# Patient Record
Sex: Female | Born: 1955
Health system: Southern US, Community
[De-identification: ages and names within clinical notes are randomized; demographics above are authoritative.]

## PROBLEM LIST (undated history)

## (undated) DIAGNOSIS — F419 Anxiety disorder, unspecified: Secondary | ICD-10-CM

## (undated) DIAGNOSIS — G8929 Other chronic pain: Secondary | ICD-10-CM

## (undated) DIAGNOSIS — G43909 Migraine, unspecified, not intractable, without status migrainosus: Secondary | ICD-10-CM

## (undated) DIAGNOSIS — B1920 Unspecified viral hepatitis C without hepatic coma: Secondary | ICD-10-CM

## (undated) DIAGNOSIS — M549 Dorsalgia, unspecified: Secondary | ICD-10-CM

## (undated) DIAGNOSIS — R109 Unspecified abdominal pain: Secondary | ICD-10-CM

## (undated) DIAGNOSIS — K660 Peritoneal adhesions (postprocedural) (postinfection): Secondary | ICD-10-CM

## (undated) HISTORY — PX: CHOLECYSTECTOMY: SHX55

## (undated) HISTORY — PX: GALLBLADDER SURGERY: SHX652

## (undated) HISTORY — PX: ABDOMINAL HYSTERECTOMY: SHX81

## (undated) HISTORY — PX: APPENDECTOMY: SHX54

---

## 2012-01-14 ENCOUNTER — Emergency Department (HOSPITAL_COMMUNITY)
Admission: EM | Admit: 2012-01-14 | Discharge: 2012-01-14 | Disposition: A | Discharge status: Home / Self Care | Payer: Self-pay | Attending: Emergency Medicine | Admitting: Emergency Medicine

## 2012-01-14 ENCOUNTER — Encounter (HOSPITAL_COMMUNITY): Payer: Self-pay | Admitting: *Deleted

## 2012-01-14 DIAGNOSIS — Z79899 Other long term (current) drug therapy: Secondary | ICD-10-CM | POA: Insufficient documentation

## 2012-01-14 DIAGNOSIS — G8929 Other chronic pain: Secondary | ICD-10-CM | POA: Insufficient documentation

## 2012-01-14 DIAGNOSIS — F17200 Nicotine dependence, unspecified, uncomplicated: Secondary | ICD-10-CM | POA: Insufficient documentation

## 2012-01-14 DIAGNOSIS — R109 Unspecified abdominal pain: Secondary | ICD-10-CM | POA: Insufficient documentation

## 2012-01-14 HISTORY — DX: Other chronic pain: G89.29

## 2012-01-14 HISTORY — DX: Dorsalgia, unspecified: M54.9

## 2012-01-14 MED ORDER — OXYCODONE-ACETAMINOPHEN 7.5-325 MG PO TABS: 1.0000 | ORAL_TABLET | ORAL | Status: AC | PRN

## 2012-01-14 NOTE — ED Provider Notes (Signed)
History     CSN: [id number]  Arrival date & time 01/14/12  1719   First MD Initiated Contact with Patient 01/14/12 [phone]      Chief Complaint  Patient presents with  . out of her meds     (Consider location/radiation/quality/duration/timing/severity/associated sxs/prior treatment) The history is provided by the patient.   Patient here with requesting refill of her methadone. She has not town and does not have a supply with her. She takes methadone due to chronic abdominal pain. Denies any fever or vomiting currently. Takes no other analgesics. Nothing makes her symptoms worse. She is otherwise at her baseline Past Medical History  Diagnosis Date  . Chronic back pain     History reviewed. No pertinent past surgical history.  No family history on file.  History  Substance Use Topics  . Smoking status: Current Everyday Smoker  . Smokeless tobacco: Not on file  . Alcohol Use: No    OB History    Grav Para Term Preterm Abortions TAB SAB Ect Mult Living                  Review of Systems  All other systems reviewed and are negative.    Allergies  Review of patient's allergies indicates no known allergies.  Home Medications   Current Outpatient Rx  Name Route Sig Dispense Refill  . METHADONE HCL 10 MG/ML PO CONC Oral Take 120 mg by mouth daily. Receives treatment at [facility]      BP 143/85  Pulse 78  Temp(Src) 98.4 F (36.9 C) (Oral)  Resp 18  SpO2 97%  Physical Exam  Nursing note and vitals reviewed. Constitutional: She is oriented to person, place, and time. Vital signs are normal. She appears well-developed and well-nourished.  Non-toxic appearance.  HENT:  Head: Normocephalic and atraumatic.  Eyes: Conjunctivae are normal. Pupils are equal, round, and reactive to light.  Neck: Normal range of motion.  Cardiovascular: Normal rate.   Pulmonary/Chest: Effort normal.  Neurological: She is alert and oriented to person, place, and time.    Skin: Skin is warm and dry.  Psychiatric: She has a normal mood and affect.    ED Course  Procedures (including critical care time)  Labs Reviewed - No data to display No results found.   No diagnosis found.    MDM  Patient to be given prescription for 6 Percocet tablets. She was instructed to call her methadone clinic back in [place] so they can arrange for her to have medications        Toy Baker, MD 01/14/12 1858

## 2012-01-14 NOTE — ED Notes (Signed)
Pt stated that she left her medications at home, [place]. She just came to town today to visit a family member. No cardiac or respiratory distress. Will continue to monitor.

## 2012-01-14 NOTE — ED Notes (Signed)
The pt is here visiting from out-of town and she left or ran out of her meds and she is here to get med s refilled.  [place] the train.  Family member is ill

## 2012-01-14 NOTE — Discharge Instructions (Signed)
Call your methadone clinic tomorrow in [place] to have them arrange for you to be seen in a methadone clinic here in [place]  Chronic Pain Chronic pain can be defined as pain that is lasting, off and on, and lasts for 3 to 6 months or longer. Many things cause chronic pain, which can make it difficult to make a discrete diagnosis. There are many treatment options available for chronic pain. However, finding a treatment that works well for you may require trying various approaches until a suitable one is found. CAUSES  In some types of chronic medical conditions, the pain is caused by a normal pain response within the body. A normal pain response helps the body identify illness or injury and prevent further damage from being done. In these cases, the cause of the pain may be identified and treated, even if it may not be cured completely. Examples of chronic conditions which can cause chronic pain include:  Inflammation of the joints (arthritis).   Back pain or neck pain (including bulging or herniated disks).   Migraine headaches.   Cancer.  In some other types of chronic pain syndromes, the pain is caused by an abnormal pain response within the body. An abnormal pain response is present when there is no ongoing cause (or stimulus) for the pain, or when the cause of the pain is arising from the nerves or nervous system itself. Examples of conditions which can cause chronic pain due to an abnormal pain response include:  Fibromyalgia.   Reflex sympathetic dystrophy (RSD).   Neuropathy (when the nerves themselves are damaged, and may cause pain).  DIAGNOSIS  Your caregiver will help diagnose your condition over time. In many cases, the initial focus will be on excluding conditions that could be causing the pain. Depending on your symptoms, your caregiver may order some tests to diagnose your condition. Some of these tests include:  Blood tests.   Computerized X-ray scans (CT scan).    Computerized magnetic scans (MRI).   X-rays.   Ultrasounds.   Nerve conduction studies.   Consultation with other physicians or specialists.  TREATMENT  There are many treatment options for people suffering from chronic pain. Finding a treatment that works well may take time.   You may be referred to a pain management specialist.   You may be put on medication to help with the pain. Unfortunately, some medications (such as opiate medications) may not be very effective in cases where chronic pain is due to abnormal pain responses. Finding the right medications can take some time.   Adjunctive therapies may be used to provide additional relief and improve a patient's quality of life. These therapies include:   Mindfulness meditation.   Acupuncture.   Biofeedback.   Cognitive-behavioral therapy.   In certain cases, surgical interventions may be attempted.  HOME CARE INSTRUCTIONS   Make sure you understand these instructions prior to discharge.   Ask any questions and share any further concerns you have with your caregiver prior to discharge.   Take all medications as directed by your caregiver.   Keep all follow-up appointments.  SEEK MEDICAL CARE IF:   Your pain gets worse.   You develop a new pain that was not present before.   You cannot tolerate any medications prescribed by your caregiver.   You develop new symptoms since your last visit with your caregiver.  SEEK IMMEDIATE MEDICAL CARE IF:   You develop muscular weakness.   You have decreased sensation or numbness.  You lose control of bowel or bladder function.   Your pain suddenly gets much worse.   You have an oral temperature above 102 F (38.9 C), not controlled by medication.   You develop shaking chills, confusion, chest pain, or shortness of breath.  Document Released: 06/23/2002 Document Revised: 09/20/2011 Document Reviewed: 09/29/2008 [facility] Patient Information 2012 [place], [place].

## 2012-02-18 ENCOUNTER — Emergency Department (HOSPITAL_COMMUNITY)
Admission: EM | Admit: 2012-02-18 | Discharge: 2012-02-18 | Disposition: A | Discharge status: Home / Self Care | Payer: Self-pay | Attending: Emergency Medicine | Admitting: Emergency Medicine

## 2012-02-18 ENCOUNTER — Emergency Department (HOSPITAL_COMMUNITY): Payer: Self-pay

## 2012-02-18 ENCOUNTER — Encounter (HOSPITAL_COMMUNITY): Payer: Self-pay | Admitting: *Deleted

## 2012-02-18 DIAGNOSIS — F17200 Nicotine dependence, unspecified, uncomplicated: Secondary | ICD-10-CM | POA: Insufficient documentation

## 2012-02-18 DIAGNOSIS — R109 Unspecified abdominal pain: Secondary | ICD-10-CM | POA: Insufficient documentation

## 2012-02-18 DIAGNOSIS — G8929 Other chronic pain: Secondary | ICD-10-CM | POA: Insufficient documentation

## 2012-02-18 DIAGNOSIS — K5900 Constipation, unspecified: Secondary | ICD-10-CM | POA: Insufficient documentation

## 2012-02-18 DIAGNOSIS — M549 Dorsalgia, unspecified: Secondary | ICD-10-CM | POA: Insufficient documentation

## 2012-02-18 HISTORY — DX: Unspecified viral hepatitis C without hepatic coma: B19.20

## 2012-02-18 HISTORY — DX: Other chronic pain: G89.29

## 2012-02-18 HISTORY — DX: Unspecified abdominal pain: R10.9

## 2012-02-18 HISTORY — DX: Migraine, unspecified, not intractable, without status migrainosus: G43.909

## 2012-02-18 LAB — CBC
MCH: 30.3 pg (ref 26.0–34.0)
Platelets: 143 10*3/uL — ABNORMAL LOW (ref 150–400)
RBC: 4.36 MIL/uL (ref 3.87–5.11)
WBC: 5 10*3/uL (ref 4.0–10.5)

## 2012-02-18 LAB — COMPREHENSIVE METABOLIC PANEL
ALT: 28 U/L (ref 0–35)
AST: 42 U/L — ABNORMAL HIGH (ref 0–37)
Alkaline Phosphatase: 90 U/L (ref 39–117)
GFR calc Af Amer: >90 mL/min (ref >90)
Glucose, Bld: 82 mg/dL (ref 70–99)
Potassium: 3.9 mEq/L (ref 3.5–5.1)
Sodium: 136 mEq/L (ref 135–145)
Total Protein: 7.5 g/dL (ref 6.0–8.3)

## 2012-02-18 LAB — DIFFERENTIAL
Eosinophils Absolute: 0.1 10*3/uL (ref 0.0–0.7)
Lymphocytes Relative: 42 % (ref 12–46)
Lymphs Abs: 2.1 10*3/uL (ref 0.7–4.0)
Neutrophils Relative %: 42 % — ABNORMAL LOW (ref 43–77)

## 2012-02-18 MED ORDER — GI COCKTAIL ~~LOC~~
30.0000 mL | Freq: Once | ORAL | Status: AC
  Administered 2012-02-18: 30 mL via ORAL
  Filled 2012-02-18: qty 30

## 2012-02-18 MED ORDER — OXYCODONE-ACETAMINOPHEN 5-325 MG PO TABS
2.0000 | ORAL_TABLET | Freq: Once | ORAL | Status: AC
  Administered 2012-02-18: 2 via ORAL
  Filled 2012-02-18: qty 2

## 2012-02-18 MED ORDER — ONDANSETRON 8 MG PO TBDP
8.0000 mg | ORAL_TABLET | Freq: Once | ORAL | Status: AC
  Administered 2012-02-18: 8 mg via ORAL
  Filled 2012-02-18: qty 1

## 2012-02-18 NOTE — ED Notes (Signed)
Pt reports RUQ pain nausea x 3 days.  Pt denies vomiting or diarrhea at this time.

## 2012-02-18 NOTE — ED Notes (Signed)
Pt changing into gown

## 2012-02-18 NOTE — ED Notes (Signed)
Pt reports she has had two gallbladder surgeries with last surgery one year ago. Pt reports no abdominal pain until January 2013 and then began to have abdominal pain again. Pt PCP told her she may have adhesions causing pain. Pt was seeing a pain clinic in [place]. Pt reports she moved here and has not seen an MD since. Pt reports this episode of pain began a month ago and reports the last three days has been more intense. Pt reports pain in upper right quadrant that she reports is painful to touch.  Pt described pain as a soreness. Pt reports nausea, but denies V/D.  Pt has tried methadone for pain, but has been without this medication x1 week.  Pt reports methadone has helped her pain, but has not established a PCP in this area and has run out of this medication.

## 2012-02-18 NOTE — Discharge Instructions (Signed)
RESOURCE GUIDE  Dental Problems  Patients with Medicaid: [facility]                     [facility] [address]                                           [address] Phone:  [phone]                                                  Phone:  [phone]  If unable to pay or uninsured, contact:  [facility] or [facility] to become qualified for the adult dental clinic.  Chronic Pain Problems Contact [facility]  [phone] Patients need to be referred by their primary care doctor.  Insufficient Money for Medicine Contact [employer]:  call "211" or [facility] 271-5999.  No Primary Care Doctor Call [facility]  [phone] Other agencies that provide inexpensive medical care    [facility]  [phone]    [facility]  [phone]    [facility]  [phone]    Women's Clinic  [phone]    [facility]  [phone]    [facility]  [phone]  Psychological Services [facility]  [phone] [facility]  [phone] [facility]   [phone] (emergency services [phone])  Substance Abuse Resources [facility]  336-882-2125 [facility] 336-784-9470 [facility] 336-285-9073 [facility] 336-845-3988 Residential & Outpatient Substance Abuse Program  800-659-3381  Abuse/Neglect [place] Child Abuse Hotline (336) 641-3795 [place] Child Abuse Hotline 800-378-5315 (After Hours)  Emergency Shelter [facility] (336) 271-5985  Maternity Homes [facility] (336) 275-9566 [facility] (704) 372-4663  MRSA Hotline #:   832-7006    [place] [facility]  [facility] [place]     [employer]                          [facility] [address] [place]                       [address]      [address]  [place]                                                [place]                            [place] Phone:  [phone]                                   Phone:  [phone]                 Phone:  [phone]  [facility] Phone:  [phone]    [facility] Child Abuse Hotline 651-009-6809 (626) 874-3853 (After Hours)   Take over the counter laxative (such as miralax, milk of magnesia, senokot) AND a dulcolax suppository today and repeat both tomorrow.  Begin to take over the counter stool softener (colace), as directed on packaging, for the next month.  Continue to take your usual prescriptions as previously directed.  Call your regular medical doctor tomorrow morning to schedule a follow up appointment within the next week.  Return to the Emergency Department immediately if worsening.

## 2012-02-18 NOTE — ED Provider Notes (Signed)
History     CSN: [id number]  Arrival date & time 02/18/12  1717   First MD Initiated Contact with Patient 02/18/12 1758      Chief Complaint  Patient presents with  . Abdominal Pain    HPI Pt was seen at 1810.  Per pt, c/o gradual onset and persistence of constant acute flair of her chronic abd pain for 1 year, worse over the last 3 days.  Has been associated with nausea.  States she has run out of her usual methadone that she takes for her chronic pain (rx by her MD in [place]).  Pt has been eval in the ED for same last month, endorses she has not found a local PMD or Pain Management doctor yet.  Denies any change in her usual chronic pain pattern, no vomiting/diarrhea, no fevers, no back pain, no dysuria, no CP/SOB.    Past Medical History  Diagnosis Date  . Chronic back pain   . Hepatitis C   . Migraine   . Chronic abdominal pain     Past Surgical History  Procedure Date  . Cholecystectomy   . Appendectomy   . Abdominal hysterectomy     History  Substance Use Topics  . Smoking status: Current Everyday Smoker  . Smokeless tobacco: Not on file  . Alcohol Use: No    Review of Systems ROS: Statement: All systems negative except as marked or noted in the HPI; Constitutional: Negative for fever and chills. ; ; Eyes: Negative for eye pain, redness and discharge. ; ; ENMT: Negative for ear pain, hoarseness, nasal congestion, sinus pressure and sore throat. ; ; Cardiovascular: Negative for chest pain, palpitations, diaphoresis, dyspnea and peripheral edema. ; ; Respiratory: Negative for cough, wheezing and stridor. ; ; Gastrointestinal: +nausea, abd pain. Negative for vomiting, diarrhea, blood in stool, hematemesis, jaundice and rectal bleeding. . ; ; Genitourinary: Negative for dysuria, flank pain and hematuria. ; ; Musculoskeletal: Negative for back pain and neck pain. Negative for swelling and trauma.; ; Skin: Negative for pruritus, rash, abrasions, blisters, bruising and skin  lesion.; ; Neuro: Negative for headache, lightheadedness and neck stiffness. Negative for weakness, altered level of consciousness , altered mental status, extremity weakness, paresthesias, involuntary movement, seizure and syncope.     Allergies  Stadol; Talwin; Toradol; and Morphine and related  Home Medications  No current outpatient prescriptions on file.  BP 119/88  Pulse 72  Temp(Src) 98.9 F (37.2 C) (Oral)  Resp 18  SpO2 100%  Physical Exam 1815: Physical examination:  Nursing notes reviewed; Vital signs and O2 SAT reviewed;  Constitutional: Well developed, Well nourished, Well hydrated, In no acute distress; Head:  Normocephalic, atraumatic; Eyes: EOMI, PERRL, No scleral icterus; ENMT: Mouth and pharynx normal, Mucous membranes moist; Neck: Supple, Full range of motion, No lymphadenopathy; Cardiovascular: Regular rate and rhythm, No murmur, rub, or gallop; Respiratory: Breath sounds clear & equal bilaterally, No rales, rhonchi, wheezes, or rub, Normal respiratory effort/excursion; Chest: Nontender, Movement normal; Abdomen: Soft, +mid-epigastric area tender to palp.  No rebound or guarding. Nondistended, Normal bowel sounds; Extremities: Pulses normal, No tenderness, No edema, No calf edema or asymmetry.; Neuro: AA&Ox3, Major CN grossly intact.  No gross focal motor or sensory deficits in extremities.; Skin: Color normal, Warm, Dry   ED Course  Procedures    MDM  MDM Reviewed: nursing note, vitals and previous chart Interpretation: labs and x-ray     Results for orders placed during the hospital encounter of 02/18/12  CBC  Component Value Range   WBC 5.0  4.0 - 10.5 (K/uL)   RBC 4.36  3.87 - 5.11 (MIL/uL)   Hemoglobin 13.2  12.0 - 15.0 (g/dL)   HCT 04.5  40.9 - 81.1 (%)   MCV 91.3  78.0 - 100.0 (fL)   MCH 30.3  26.0 - 34.0 (pg)   MCHC 33.2  30.0 - 36.0 (g/dL)   RDW 91.4  78.2 - 95.6 (%)   Platelets 143 (*) 150 - 400 (K/uL)  DIFFERENTIAL      Component Value  Range   Neutrophils Relative 42 (*) 43 - 77 (%)   Neutro Abs 2.1  1.7 - 7.7 (K/uL)   Lymphocytes Relative 42  12 - 46 (%)   Lymphs Abs 2.1  0.7 - 4.0 (K/uL)   Monocytes Relative 13 (*) 3 - 12 (%)   Monocytes Absolute 0.7  0.1 - 1.0 (K/uL)   Eosinophils Relative 3  0 - 5 (%)   Eosinophils Absolute 0.1  0.0 - 0.7 (K/uL)   Basophils Relative 0  0 - 1 (%)   Basophils Absolute 0.0  0.0 - 0.1 (K/uL)  COMPREHENSIVE METABOLIC PANEL      Component Value Range   Sodium 136  135 - 145 (mEq/L)   Potassium 3.9  3.5 - 5.1 (mEq/L)   Chloride 100  96 - 112 (mEq/L)   CO2 28  19 - 32 (mEq/L)   Glucose, Bld 82  70 - 99 (mg/dL)   BUN 9  6 - 23 (mg/dL)   Creatinine, Ser 2.13  0.50 - 1.10 (mg/dL)   Calcium 9.0  8.4 - 08.6 (mg/dL)   Total Protein 7.5  6.0 - 8.3 (g/dL)   Albumin 3.4 (*) 3.5 - 5.2 (g/dL)   AST 42 (*) 0 - 37 (U/L)   ALT 28  0 - 35 (U/L)   Alkaline Phosphatase 90  39 - 117 (U/L)   Total Bilirubin 0.2 (*) 0.3 - 1.2 (mg/dL)   GFR calc non Af Amer >90  >90 (mL/min)   GFR calc Af Amer >90  >90 (mL/min)  LIPASE, BLOOD      Component Value Range   Lipase 18  11 - 59 (U/L)    Dg Abd Acute W/chest 02/18/2012  *RADIOLOGY REPORT*  Clinical Data: Upper abdominal pain for 3 days.  Tobacco use.  ACUTE ABDOMEN SERIES (ABDOMEN 2 VIEW & CHEST 1 VIEW)  Comparison: None.  Findings: Cardiac and mediastinal contours appear normal.  The lungs appear clear.  No pleural effusion is identified.  No free intraperitoneal gas noted.  Prominence of stool throughout the colon suggests constipation.  No significant abnormal air-fluid levels or dilated small bowel noted.  IMPRESSION:  1. Prominence of stool throughout the colon suggests constipation.  Original Report Authenticated By: Dellia Cloud, M.D.     Ruth.Sill:  Pt has tol PO well while in ED without N/V.  No stooling while in ED.  VS remain stable.  Mild AST elevation, but no old to compare.  Dx testing d/w pt.  Questions answered.  Verb understanding,  agreeable to d/c home with outpt f/u.         Laray Anger, DO 02/20/12 2157

## 2012-04-09 ENCOUNTER — Encounter (HOSPITAL_COMMUNITY): Payer: Self-pay | Admitting: *Deleted

## 2012-04-09 DIAGNOSIS — Z888 Allergy status to other drugs, medicaments and biological substances status: Secondary | ICD-10-CM | POA: Insufficient documentation

## 2012-04-09 DIAGNOSIS — R319 Hematuria, unspecified: Secondary | ICD-10-CM | POA: Insufficient documentation

## 2012-04-09 DIAGNOSIS — F17200 Nicotine dependence, unspecified, uncomplicated: Secondary | ICD-10-CM | POA: Insufficient documentation

## 2012-04-09 DIAGNOSIS — Z885 Allergy status to narcotic agent status: Secondary | ICD-10-CM | POA: Insufficient documentation

## 2012-04-09 DIAGNOSIS — N390 Urinary tract infection, site not specified: Secondary | ICD-10-CM | POA: Insufficient documentation

## 2012-04-09 DIAGNOSIS — Z90710 Acquired absence of both cervix and uterus: Secondary | ICD-10-CM | POA: Insufficient documentation

## 2012-04-09 DIAGNOSIS — B1920 Unspecified viral hepatitis C without hepatic coma: Secondary | ICD-10-CM | POA: Insufficient documentation

## 2012-04-09 NOTE — ED Notes (Signed)
Pt c/o blood in urine while at work and swelling in lower legs.  Also c/o right sided sharp abdominal pain.

## 2012-04-10 ENCOUNTER — Emergency Department (HOSPITAL_COMMUNITY)
Admission: EM | Admit: 2012-04-10 | Discharge: 2012-04-10 | Disposition: A | Discharge status: Home / Self Care | Payer: Self-pay | Attending: Emergency Medicine | Admitting: Emergency Medicine

## 2012-04-10 DIAGNOSIS — N390 Urinary tract infection, site not specified: Secondary | ICD-10-CM

## 2012-04-10 HISTORY — DX: Peritoneal adhesions (postprocedural) (postinfection): K66.0

## 2012-04-10 LAB — BASIC METABOLIC PANEL
BUN: 11 mg/dL (ref 6–23)
CO2: 27 mEq/L (ref 19–32)
Chloride: 103 mEq/L (ref 96–112)
GFR calc non Af Amer: >90 mL/min (ref >90)
Glucose, Bld: 80 mg/dL (ref 70–99)
Potassium: 3.5 mEq/L (ref 3.5–5.1)

## 2012-04-10 LAB — CBC WITH DIFFERENTIAL/PLATELET
Eosinophils Absolute: 0.2 10*3/uL (ref 0.0–0.7)
Hemoglobin: 12.3 g/dL (ref 12.0–15.0)
Lymphs Abs: 2.2 10*3/uL (ref 0.7–4.0)
MCH: 30.5 pg (ref 26.0–34.0)
Monocytes Relative: 17 % — ABNORMAL HIGH (ref 3–12)
Neutrophils Relative %: 37 % — ABNORMAL LOW (ref 43–77)
RBC: 4.03 MIL/uL (ref 3.87–5.11)

## 2012-04-10 LAB — URINE MICROSCOPIC-ADD ON

## 2012-04-10 LAB — URINALYSIS, ROUTINE W REFLEX MICROSCOPIC
Ketones, ur: NEGATIVE mg/dL
Nitrite: POSITIVE — AB
Urobilinogen, UA: 0.2 mg/dL (ref 0.0–1.0)
pH: 5.5 (ref 5.0–8.0)

## 2012-04-10 MED ORDER — CIPROFLOXACIN IN D5W 400 MG/200ML IV SOLN
400.0000 mg | Freq: Once | INTRAVENOUS | Status: AC
  Administered 2012-04-10: 400 mg via INTRAVENOUS
  Filled 2012-04-10: qty 200

## 2012-04-10 MED ORDER — ONDANSETRON HCL 4 MG PO TABS: 4.0000 mg | ORAL_TABLET | Freq: Three times a day (TID) | ORAL | Status: DC | PRN

## 2012-04-10 MED ORDER — DEXTROSE 5 % IV SOLN
1.0000 g | Freq: Once | INTRAVENOUS | Status: AC
  Administered 2012-04-10: 1 g via INTRAVENOUS
  Filled 2012-04-10: qty 10

## 2012-04-10 MED ORDER — CIPROFLOXACIN HCL 500 MG PO TABS: 500.0000 mg | ORAL_TABLET | Freq: Two times a day (BID) | ORAL | Status: AC

## 2012-04-10 MED ORDER — OXYCODONE-ACETAMINOPHEN 5-325 MG PO TABS: 2.0000 | ORAL_TABLET | ORAL | Status: DC | PRN

## 2012-04-10 MED ORDER — FENTANYL CITRATE 0.05 MG/ML IJ SOLN
100.0000 ug | Freq: Once | INTRAMUSCULAR | Status: DC
  Filled 2012-04-10: qty 2

## 2012-04-10 MED ORDER — ONDANSETRON HCL 4 MG/2ML IJ SOLN
4.0000 mg | Freq: Once | INTRAMUSCULAR | Status: AC
  Administered 2012-04-10: 4 mg via INTRAVENOUS
  Filled 2012-04-10: qty 2

## 2012-04-10 MED ORDER — HYDROMORPHONE HCL PF 1 MG/ML IJ SOLN
1.0000 mg | Freq: Once | INTRAMUSCULAR | Status: AC
  Administered 2012-04-10: 1 mg via INTRAVENOUS
  Filled 2012-04-10: qty 1

## 2012-04-10 NOTE — ED Provider Notes (Signed)
History     CSN: [id number]  Arrival date & time 04/09/12  2258   First MD Initiated Contact with Patient 04/10/12 0217      Chief Complaint  Patient presents with  . Hematuria    (Consider location/radiation/quality/duration/timing/severity/associated sxs/prior treatment) HPI 56 year old female presents to emergency department complaining of right-sided abdominal pain, hematuria. Patient also complaining of swelling in her legs ongoing for the last 3 days. Patient denies previous history of leg swelling or CHF. Patient denies fever. She has had nausea without vomiting. Her right-sided abdominal pain feels like a prior kidney stone. Patient has overall felt unwell, with less energy than she normally does. She has tried to help leg swelling by keeping her legs elevated, but reports this has not helped.  Past Medical History  Diagnosis Date  . Hepatitis C   . Abdominal adhesions     Past Surgical History  Procedure Date  . Gallbladder surgery   . Abdominal hysterectomy   . Appendectomy     History reviewed. No pertinent family history.  History  Substance Use Topics  . Smoking status: Current Everyday Smoker -- 0.5 packs/day  . Smokeless tobacco: Not on file  . Alcohol Use: No    OB History    Grav Para Term Preterm Abortions TAB SAB Ect Mult Living                  Review of Systems  All other systems reviewed and are negative.    Allergies  Toradol; Stadol; Talwin; and Morphine and related  Home Medications  No current outpatient prescriptions on file.  BP 122/72  Pulse 70  Temp 97.6 F (36.4 C) (Oral)  Resp 16  SpO2 99%  Physical Exam  Nursing note and vitals reviewed. Constitutional: She is oriented to person, place, and time. She appears well-developed and well-nourished.  HENT:  Head: Normocephalic and atraumatic.  Nose: Nose normal.  Mouth/Throat: Oropharynx is clear and moist.  Eyes: Conjunctivae and EOM are normal. Pupils are equal, round,  and reactive to light.  Neck: Normal range of motion. Neck supple. No JVD present. No tracheal deviation present. No thyromegaly present.  Cardiovascular: Normal rate, regular rhythm, normal heart sounds and intact distal pulses.  Exam reveals no gallop and no friction rub.   No murmur heard. Pulmonary/Chest: Effort normal and breath sounds normal. No stridor. No respiratory distress. She has no wheezes. She has no rales. She exhibits no tenderness.  Abdominal: Soft. Bowel sounds are normal. She exhibits no distension and no mass. There is tenderness (Diffuse tenderness over right-sided abdomen, epigastric. No rebound or guarding). There is no rebound and no guarding.  Musculoskeletal: Normal range of motion. She exhibits no edema and no tenderness.  Lymphadenopathy:    She has no cervical adenopathy.  Neurological: She is oriented to person, place, and time. She exhibits normal muscle tone. Coordination normal.  Skin: Skin is dry. No rash noted. No erythema. No pallor.  Psychiatric: She has a normal mood and affect. Her behavior is normal. Judgment and thought content normal.    ED Course  Procedures (including critical care time)  Labs Reviewed  URINALYSIS, ROUTINE W REFLEX MICROSCOPIC - Abnormal; Notable for the following:    Color, Urine AMBER (*)  BIOCHEMICALS MAY BE AFFECTED BY COLOR   APPearance CLOUDY (*)     Hgb urine dipstick MODERATE (*)     Bilirubin Urine SMALL (*)     Nitrite POSITIVE (*)     Leukocytes, UA  MODERATE (*)     All other components within normal limits  URINE MICROSCOPIC-ADD ON - Abnormal; Notable for the following:    Bacteria, UA MANY (*)     All other components within normal limits  CBC WITH DIFFERENTIAL - Abnormal; Notable for the following:    Platelets 133 (*)     Neutrophils Relative 37 (*)     Monocytes Relative 17 (*)     All other components within normal limits  BASIC METABOLIC PANEL  URINALYSIS, ROUTINE W REFLEX MICROSCOPIC   No results  found.   1. Urinary tract infection       MDM  Patient with significant urinary tract infection with pain into right abdomen, may be early pyelonephritis. We'll check baseline labs, give IV antibiotics, and control nausea. If patient is able to pass by mouth trial and labs are reassuring, feel patient would be good candidate for outpatient management of early pyelonephritis.        Olivia Mackie, MD 04/10/12 [phone]

## 2012-04-10 NOTE — Discharge Instructions (Signed)
Take medications as prescribed. Return to the emergency department for fever, chills, vomiting, worsening pain or other concerning symptoms. Please followup with your doctor for recheck in 3-5 days. If you do not have a doctor, followup with one of the numbers listed below.  Urinary Tract Infection Infections of the urinary tract can start in several places. A bladder infection (cystitis), a kidney infection (pyelonephritis), and a prostate infection (prostatitis) are different types of urinary tract infections (UTIs). They usually get better if treated with medicines (antibiotics) that kill germs. Take all the medicine until it is gone. You or your child may feel better in a few days, but TAKE ALL MEDICINE or the infection may not respond and may become more difficult to treat. HOME CARE INSTRUCTIONS   Drink enough water and fluids to keep the urine clear or pale yellow. Cranberry juice is especially recommended, in addition to large amounts of water.   Avoid caffeine, tea, and carbonated beverages. They tend to irritate the bladder.   Alcohol may irritate the prostate.   Only take over-the-counter or prescription medicines for pain, discomfort, or fever as directed by your caregiver.  To prevent further infections:  Empty the bladder often. Avoid holding urine for long periods of time.   After a bowel movement, women should cleanse from front to back. Use each tissue only once.   Empty the bladder before and after sexual intercourse.  FINDING OUT THE RESULTS OF YOUR TEST Not all test results are available during your visit. If your or your child's test results are not back during the visit, make an appointment with your caregiver to find out the results. Do not assume everything is normal if you have not heard from your caregiver or the medical facility. It is important for you to follow up on all test results. SEEK MEDICAL CARE IF:   There is back pain.   Your baby is older than 3 months  with a rectal temperature of 100.5 F (38.1 C) or higher for more than 1 day.   Your or your child's problems (symptoms) are no better in 3 days. Return sooner if you or your child is getting worse.  SEEK IMMEDIATE MEDICAL CARE IF:   There is severe back pain or lower abdominal pain.   You or your child develops chills.   You have a fever.   Your baby is older than 3 months with a rectal temperature of 102 F (38.9 C) or higher.   Your baby is 78 months old or younger with a rectal temperature of 100.4 F (38 C) or higher.   There is nausea or vomiting.   There is continued burning or discomfort with urination.  MAKE SURE YOU:   Understand these instructions.   Will watch your condition.   Will get help right away if you are not doing well or get worse.  Document Released: 07/11/2005 Document Revised: 09/20/2011 Document Reviewed: 02/13/2007 [facility] Patient Information 2012 [place], [place].   RESOURCE GUIDE  Dental Problems  Patients with Medicaid: [facility] [phone] [address]  [address] Phone:  [phone]                                                                             Phone:  [phone]  If unable to pay or uninsured, contact:  [facility] or [facility]. to become qualified for the adult dental clinic.  Chronic Pain Problems Contact [facility]  [phone] Patients need to be referred by their primary care doctor.  Insufficient Money for Medicine Contact [employer]:  call "211" or [facility] 785-751-4295.  No Primary Care Doctor Call [facility]  [phone] Other agencies that provide inexpensive medical care    [facility]  [phone]    [facility]  [phone]    [facility]  [phone]    [facility]  [phone]     [facility]  4168687849    [facility]  [phone]  Psychological Services [facility]  [phone] [facility]  [phone] [facility] Mental Health   6310471785 (emergency services [phone])  Abuse/Neglect [facility] Child Abuse Hotline [phone] [facility] Child Abuse Hotline (716)693-8618 (After Hours)  Emergency Shelter [facility] (870)031-8731  [facility] [place] [facility] 8038710662 [facility] [phone]  MRSA Hotline #:   (626) 404-0712  [facility] Resources  [facility] [place] [facility]     [employer]                          [facility]. [address] [place]                        [address]          [address]  Blondell Reveal Phone:  [phone]                                     Phone:  [phone]                   Phone:  [phone]  [facility] Mental Health Phone:  [phone]  [facility] Child Abuse Hotline 708-730-8380 (785)417-8297 (After Hours)       *free text    If you develop symptoms of Shortness of Breath, Chest Pain, Swelling of lips, mouth or tongue or if your condition becomes worse with any new symptoms, see your doctor or return to the Emergency Department for immediate care. Emergency services are not intended to be a substitute for comprehensive medical attention.  Please contact your doctor for follow up if not improving as expected.   Call your doctor in 5-7 days or as directed if there is no improvement.   Community Resources: *IF YOU ARE IN IMMEDIATE DANGER CALL 911!  Abuse/Neglect:  Family Services Crisis Hotline [facility]): [phone] [facility]): 810-734-8079  After hours, holidays and weekends: (737)019-1087 National Domestic Violence  Hotline: 514-035-0708  Mental Health: [facility] Mental Health: Drucie Ip: [phone]  Health Clinics:  [facility]): [phone] Monday - Friday 8 AM - 9 PM, Saturday and Sunday 10 AM - 9 PM  [facility] [address]: [phone] Monday - Friday 8 AM - 5 PM  [facility]  [address]: [phone] Monday- Friday 8:30 AM - 5:30 PM, Sat 9 AM - 1 PM  24 HR [place] [facility] [address]: [phone] [facility]: [phone] [facility] [address]: [phone]  24 HR [facility]: [address] [phone]

## 2012-04-14 ENCOUNTER — Encounter (HOSPITAL_COMMUNITY): Payer: Self-pay | Admitting: *Deleted

## 2012-04-14 ENCOUNTER — Emergency Department (HOSPITAL_COMMUNITY): Payer: Self-pay

## 2012-04-14 ENCOUNTER — Emergency Department (HOSPITAL_COMMUNITY)
Admission: EM | Admit: 2012-04-14 | Discharge: 2012-04-14 | Disposition: A | Discharge status: Home / Self Care | Payer: Self-pay | Attending: Emergency Medicine | Admitting: Emergency Medicine

## 2012-04-14 DIAGNOSIS — R7402 Elevation of levels of lactic acid dehydrogenase (LDH): Secondary | ICD-10-CM | POA: Insufficient documentation

## 2012-04-14 DIAGNOSIS — M7989 Other specified soft tissue disorders: Secondary | ICD-10-CM | POA: Insufficient documentation

## 2012-04-14 DIAGNOSIS — R7401 Elevation of levels of liver transaminase levels: Secondary | ICD-10-CM | POA: Insufficient documentation

## 2012-04-14 DIAGNOSIS — R748 Abnormal levels of other serum enzymes: Secondary | ICD-10-CM

## 2012-04-14 DIAGNOSIS — R9431 Abnormal electrocardiogram [ECG] [EKG]: Secondary | ICD-10-CM | POA: Insufficient documentation

## 2012-04-14 DIAGNOSIS — R609 Edema, unspecified: Secondary | ICD-10-CM | POA: Insufficient documentation

## 2012-04-14 DIAGNOSIS — Z8619 Personal history of other infectious and parasitic diseases: Secondary | ICD-10-CM | POA: Insufficient documentation

## 2012-04-14 LAB — CBC WITH DIFFERENTIAL/PLATELET
Basophils Relative: 0 % (ref 0–1)
Eosinophils Absolute: 0.1 10*3/uL (ref 0.0–0.7)
Eosinophils Relative: 3 % (ref 0–5)
HCT: 38.1 % (ref 36.0–46.0)
Hemoglobin: 12.6 g/dL (ref 12.0–15.0)
MCH: 30.4 pg (ref 26.0–34.0)
MCHC: 33.1 g/dL (ref 30.0–36.0)
MCV: 91.8 fL (ref 78.0–100.0)
Monocytes Absolute: 0.5 10*3/uL (ref 0.1–1.0)
Monocytes Relative: 12 % (ref 3–12)

## 2012-04-14 LAB — COMPREHENSIVE METABOLIC PANEL
Albumin: 3.4 g/dL — ABNORMAL LOW (ref 3.5–5.2)
BUN: 7 mg/dL (ref 6–23)
Creatinine, Ser: 0.61 mg/dL (ref 0.50–1.10)
GFR calc Af Amer: >90 mL/min (ref >90)
Glucose, Bld: 81 mg/dL (ref 70–99)
Total Bilirubin: 0.2 mg/dL — ABNORMAL LOW (ref 0.3–1.2)
Total Protein: 7.5 g/dL (ref 6.0–8.3)

## 2012-04-14 MED ORDER — ALPRAZOLAM 0.5 MG PO TABS: 0.5000 mg | ORAL_TABLET | Freq: Every evening | ORAL | Status: AC | PRN

## 2012-04-14 MED ORDER — FUROSEMIDE 40 MG PO TABS: 20.0000 mg | ORAL_TABLET | Freq: Every day | ORAL | Status: DC

## 2012-04-14 MED ORDER — LORAZEPAM 1 MG PO TABS
1.0000 mg | ORAL_TABLET | Freq: Once | ORAL | Status: AC
  Administered 2012-04-14: 1 mg via ORAL
  Filled 2012-04-14: qty 1

## 2012-04-14 MED ORDER — ALPRAZOLAM 0.25 MG PO TABS: 0.5000 mg | ORAL_TABLET | Freq: Once | ORAL | Status: DC

## 2012-04-14 NOTE — ED Notes (Signed)
Pt was unable to stay for d/c vitals due to time constraints.

## 2012-04-14 NOTE — Discharge Instructions (Signed)
Your testing did not show an obvious reason for your leg swelling. The treatment will be symptomatic. I am giving her a prescription for a diuretic, which is a medication to make you urinate more. Please try to stay on a low-salt diet. Also, some blood tests of your liver were slightly elevated. There are many things which can cause this, and additional testing would normally be done as an outpatient.  Edema Edema is an abnormal build-up of fluids in tissues. Because this is partly dependent on gravity (water flows to the lowest place), it is more common in the leg sand thighs (lower extremities). It is also common in the looser tissues, like around the eyes. Painless swelling of the feet and ankles is common and increases as a person ages. It may affect both legs and may include the calves or even thighs. When squeezed, the fluid may move out of the affected area and may leave a dent for a few moments. CAUSES   Prolonged standing or sitting in one place for extended periods of time. Movement helps pump tissue fluid into the veins, and absence of movement prevents this, resulting in edema.   Varicose veins. The valves in the veins do not work as well as they should. This causes fluid to leak into the tissues.   Fluid and salt overload.   Injury, burn, or surgery to the leg, ankle, or foot, may damage veins and allow fluid to leak out.   Sunburn damages vessels. Leaky vessels allow fluid to go out into the sunburned tissues.   Allergies (from insect bites or stings, medications or chemicals) cause swelling by allowing vessels to become leaky.   Protein in the blood helps keep fluid in your vessels. Low protein, as in malnutrition, allows fluid to leak out.   Hormonal changes, including pregnancy and menstruation, cause fluid retention. This fluid may leak out of vessels and cause edema.   Medications that cause fluid retention. Examples are sex hormones, blood pressure medications, steroid  treatment, or anti-depressants.   Some illnesses cause edema, especially heart failure, kidney disease, or liver disease.   Surgery that cuts veins or lymph nodes, such as surgery done for the heart or for breast cancer, may result in edema.  DIAGNOSIS  Your caregiver is usually easily able to determine what is causing your swelling (edema) by simply asking what is wrong (getting a history) and examining you (doing a physical). Sometimes x-rays, EKG (electrocardiogram or heart tracing), and blood work may be done to evaluate for underlying medical illness. TREATMENT  General treatment includes:  Leg elevation (or elevation of the affected body part).   Restriction of fluid intake.   Prevention of fluid overload.   Compression of the affected body part. Compression with elastic bandages or support stockings squeezes the tissues, preventing fluid from entering and forcing it back into the blood vessels.   Diuretics (also called water pills or fluid pills) pull fluid out of your body in the form of increased urination. These are effective in reducing the swelling, but can have side effects and must be used only under your caregiver's supervision. Diuretics are appropriate only for some types of edema.  The specific treatment can be directed at any underlying causes discovered. Heart, liver, or kidney disease should be treated appropriately. HOME CARE INSTRUCTIONS   Elevate the legs (or affected body part) above the level of the heart, while lying down.   Avoid sitting or standing still for prolonged periods of time.  Avoid putting anything directly under the knees when lying down, and do not wear constricting clothing or garters on the upper legs.   Exercising the legs causes the fluid to work back into the veins and lymphatic channels. This may help the swelling go down.   The pressure applied by elastic bandages or support stockings can help reduce ankle swelling.   A low-salt diet  may help reduce fluid retention and decrease the ankle swelling.   Take any medications exactly as prescribed.  SEEK MEDICAL CARE IF:  Your edema is not responding to recommended treatments. SEEK IMMEDIATE MEDICAL CARE IF:   You develop shortness of breath or chest pain.   You cannot breathe when you lay down; or if, while lying down, you have to get up and go to the window to get your breath.   You are having increasing swelling without relief from treatment.   You develop a fever over 102 F (38.9 C).   You develop pain or redness in the areas that are swollen.   Tell your caregiver right away if you have gained 3 lb/1.4 kg in 1 day or 5 lb/2.3 kg in a week.  MAKE SURE YOU:   Understand these instructions.   Will watch your condition.   Will get help right away if you are not doing well or get worse.  Document Released: 10/01/2005 Document Revised: 09/20/2011 Document Reviewed: 05/19/2008 [facility] Patient Information 2012 [place], [place].  Furosemide tablets What is this medicine? FUROSEMIDE (fyoor [place] se mide) is a diuretic. It helps you make more urine and to lose salt and excess water from your body. This medicine is used to treat high blood pressure, and edema or swelling from heart, kidney, or liver disease. This medicine may be used for other purposes; ask your health care provider or pharmacist if you have questions. What should I tell my health care provider before I take this medicine? They need to know if you have any of these conditions: -abnormal blood electrolytes -diarrhea or vomiting -gout -heart disease -kidney disease, small amounts of urine, or difficulty passing urine -liver disease -an unusual or allergic reaction to furosemide, sulfa drugs, other medicines, foods, dyes, or preservatives -pregnant or trying to get pregnant -breast-feeding How should I use this medicine? Take this medicine by mouth with a glass of water. Follow the directions on the  prescription label. You may take this medicine with or without food. If it upsets your stomach, take it with food or milk. Do not take your medicine more often than directed. Remember that you will need to pass more urine after taking this medicine. Do not take your medicine at a time of day that will cause you problems. Do not take at bedtime. Talk to your pediatrician regarding the use of this medicine in children. While this drug may be prescribed for selected conditions, precautions do apply. Overdosage: If you think you have taken too much of this medicine contact a poison control center or emergency room at once. NOTE: This medicine is only for you. Do not share this medicine with others. What if I miss a dose? If you miss a dose, take it as soon as you can. If it is almost time for your next dose, take only that dose. Do not take double or extra doses. What may interact with this medicine? -aspirin and aspirin-like medicines -certain antibiotics -chloral hydrate -cisplatin -cyclosporine -digoxin -diuretics -laxatives -lithium -medicines for blood pressure -medicines that relax muscles for surgery -methotrexate -  NSAIDs, medicines for pain and inflammation like ibuprofen, naproxen, or indomethacin -phenytoin -steroid medicines like prednisone or cortisone -sucralfate This list may not describe all possible interactions. Give your health care provider a list of all the medicines, herbs, non-prescription drugs, or dietary supplements you use. Also tell them if you smoke, drink alcohol, or use illegal drugs. Some items may interact with your medicine. What should I watch for while using this medicine? Visit your doctor or health care professional for regular checks on your progress. Check your blood pressure regularly. Ask your doctor or health care professional what your blood pressure should be, and when you should contact him or her. If you are a diabetic, check your blood sugar as  directed. You may need to be on a special diet while taking this medicine. Check with your doctor. Also, ask how many glasses of fluid you need to drink a day. You must not get dehydrated. You may get drowsy or dizzy. Do not drive, use machinery, or do anything that needs mental alertness until you know how this drug affects you. Do not stand or sit up quickly, especially if you are an older patient. This reduces the risk of dizzy or fainting spells. Alcohol can make you more drowsy and dizzy. Avoid alcoholic drinks. This medicine can make you more sensitive to the sun. Keep out of the sun. If you cannot avoid being in the sun, wear protective clothing and use sunscreen. Do not use sun lamps or tanning beds/booths. What side effects may I notice from receiving this medicine? Side effects that you should report to your doctor or health care professional as soon as possible: -blood in urine or stools -dry mouth -fever or chills -hearing loss or ringing in the ears -irregular heartbeat -muscle pain or weakness, cramps -skin rash -stomach upset, pain, or nausea -tingling or numbness in the hands or feet -unusually weak or tired -vomiting or diarrhea -yellowing of the eyes or skin Side effects that usually do not require medical attention (report to your doctor or health care professional if they continue or are bothersome): -headache -loss of appetite -unusual bleeding or bruising This list may not describe all possible side effects. Call your doctor for medical advice about side effects. You may report side effects to FDA at 1-800-FDA-1088. Where should I keep my medicine? Keep out of the reach of children. Store at room temperature between 15 and 30 degrees C (59 and 86 degrees F). Protect from light. Throw away any unused medicine after the expiration date. NOTE: This sheet is a summary. It may not cover all possible information. If you have questions about this medicine, talk to your  doctor, pharmacist, or health care provider.  2012, Elsevier/Gold Standard. (09/19/2009 4:24:50 PM)  Alprazolam tablets What is this medicine? ALPRAZOLAM (al PRAY zoe lam) is a benzodiazepine. It is used to treat anxiety and panic attacks. This medicine may be used for other purposes; ask your health care provider or pharmacist if you have questions. What should I tell my health care provider before I take this medicine? They need to know if you have any of these conditions: -an alcohol or drug abuse problem -bipolar disorder, depression, psychosis or other mental health conditions -glaucoma -kidney or liver disease -lung or breathing disease -myasthenia gravis -Parkinson's disease -porphyria -seizures or a history of seizures -suicidal thoughts -an unusual or allergic reaction to alprazolam, other benzodiazepines, foods, dyes, or preservatives -pregnant or trying to get pregnant -breast-feeding How should I use this  medicine? Take this medicine by mouth with a glass of water. Follow the directions on the prescription label. Take your medicine at regular intervals. Do not take it more often than directed. If you have been taking this medicine regularly for some time, do not suddenly stop taking it. You must gradually reduce the dose or you may get severe side effects. Ask your doctor or health care professional for advice. Even after you stop taking this medicine it can still affect your body for several days. Talk to your pediatrician regarding the use of this medicine in children. Special care may be needed. Overdosage: If you think you have taken too much of this medicine contact a poison control center or emergency room at once. NOTE: This medicine is only for you. Do not share this medicine with others. What if I miss a dose? If you miss a dose, take it as soon as you can. If it is almost time for your next dose, take only that dose. Do not take double or extra doses. What may  interact with this medicine? Do not take this medicine with any of the following medications: -certain medicines for HIV infection or AIDS -ketoconazole -itraconazole This medicine may also interact with the following medications: -birth control pills -certain macrolide antibiotics like clarithromycin, erythromycin, troleandomycin -cimetidine -cyclosporine -ergotamine -grapefruit juice -herbal or dietary supplements like kava kava, melatonin, dehydroepiandrosterone, DHEA, St. John's Wort or valerian -imatinib, STI-571 -isoniazid -levodopa -medicines for depression, anxiety, or psychotic disturbances -prescription pain medicines -rifampin, rifapentine, or rifabutin -some medicines for blood pressure or heart problems -some medicines for seizures like carbamazepine, oxcarbazepine, phenobarbital, phenytoin, primidone This list may not describe all possible interactions. Give your health care provider a list of all the medicines, herbs, non-prescription drugs, or dietary supplements you use. Also tell them if you smoke, drink alcohol, or use illegal drugs. Some items may interact with your medicine. What should I watch for while using this medicine? Visit your doctor or health care professional for regular checks on your progress. Your body can become dependent on this medicine. Ask your doctor or health care professional if you still need to take it. You may get drowsy or dizzy. Do not drive, use machinery, or do anything that needs mental alertness until you know how this medicine affects you. To reduce the risk of dizzy and fainting spells, do not stand or sit up quickly, especially if you are an older patient. Alcohol may increase dizziness and drowsiness. Avoid alcoholic drinks. Do not treat yourself for coughs, colds or allergies without asking your doctor or health care professional for advice. Some ingredients can increase possible side effects. What side effects may I notice from  receiving this medicine? Side effects that you should report to your doctor or health care professional as soon as possible: -allergic reactions like skin rash, itching or hives, swelling of the face, lips, or tongue -confusion, forgetfulness -depression -difficulty sleeping -difficulty speaking -feeling faint or lightheaded, falls -mood changes, excitability or aggressive behavior -muscle cramps -trouble passing urine or change in the amount of urine -unusually weak or tired Side effects that usually do not require medical attention (report to your doctor or health care professional if they continue or are bothersome): -change in sex drive or performance -changes in appetite This list may not describe all possible side effects. Call your doctor for medical advice about side effects. You may report side effects to FDA at 1-800-FDA-1088. Where should I keep my medicine? Keep out of the  reach of children. This medicine can be abused. Keep your medicine in a safe place to protect it from theft. Do not share this medicine with anyone. Selling or giving away this medicine is dangerous and against the law. Store at room temperature between 20 and 25 degrees C (68 and 77 degrees F). Throw away any unused medicine after the expiration date. NOTE: This sheet is a summary. It may not cover all possible information. If you have questions about this medicine, talk to your doctor, pharmacist, or health care provider.  2012, Elsevier/Gold Standard. (12/25/2007 10:34:46 AM)  RESOURCE GUIDE  Chronic Pain Problems: Contact [facility]  [phone] Patients need to be referred by their primary care doctor.  Insufficient Money for Medicine: Contact United Way:  call "211" or [facility] [phone].  No Primary Care Doctor: - Call [facility]  [phone] - can help you locate a primary care doctor that  accepts your insurance, provides certain services, etc. - Physician  Referral Service- [phone]  Agencies that provide inexpensive medical care: - [facility]  [phone] - [facility]  [phone] - [facility]  [phone] - Women's Clinic  [phone] - Planned Parenthood  [phone] [facility]  [phone]  Medicaid-accepting [facility] Providers: - [facility]- [address]  [phone], Mon-Fri 9am-7pm, Sat 9am-1pm - [facility]- [address] [place], [address] [place]  [phone] - [facility]- [address] [place]  [phone] [facility]- [address]  [phone] - Renaye Rakers- [address] [place], [address], [phone]  Only accepts [place] patients after they have their name  applied to their card  Self Pay (no insurance) in [place]: - Sickle Cell Patients: Dr Willey Blade, [facility]  [address] [place], [phone] - [facility]- [address] [place]  [phone]       [facility] [place]- [address]       -     [facility]- see information above (Speak to [employer] if you do not have insurance)       -  [facility]- [address] [place], [phone]       -  [facility]- [address],  [phone]       -  [facility]- [address], [phone]       -  Dr Julio Sicks-  [address], [place], [phone]       -  [facility]- [address] [phone]       -  [facility]- [address] [phone], also [address], [phone]       -    [facility]- [address] [place], [phone], 1st & 3rd Saturday   every month, 10am-1pm  1) Find a Doctor and Pay Out of Pocket Although you won't have to find out who is covered by your insurance plan, it is a good idea to ask around and get recommendations. You will then need to  call the office and see if the doctor you have chosen will accept you as a new patient and what types of options they offer for patients who are self-pay. Some doctors offer discounts  or will set up payment plans for their patients who do not have insurance, but you will need to ask so you aren't surprised when you get to your appointment.  2) Contact Your Local Health Department Not all health departments have doctors that can see patients for sick visits, but many do, so it is worth a call to see if yours does. If you don't know where your local health department is, you can check in your phone book. The CDC also has a tool to help you locate your state's health department, and many state websites also have listings of all of their local health departments.  3) Find a Walk-in Clinic If your illness is not likely to be very severe or complicated, you may want to try a walk in clinic. These are popping up all over the country in pharmacies, drugstores, and shopping centers. They're usually staffed by nurse practitioners or physician assistants that have been trained to treat common illnesses and complaints. They're usually fairly quick and inexpensive. However, if you have serious medical issues or chronic medical problems, these are probably not your best option  STD Testing - [facility] Department of [facility] [place], STD Clinic, [address], [place], phone [phone] or [phone].  Monday - Friday, call for an appointment. [facility] Department of [employer], STD Clinic, [address], [place], phone [phone] or [phone].  Monday - Friday, call for an appointment.  Abuse/Neglect: [facility] Child Abuse Hotline ([phone] [facility] Child Abuse Hotline 8128414738 (After Hours)  Emergency Shelter:  [facility] 801-162-4605  Maternity Homes: - [facility] at [facility] [place] [facility] 862-480-5115 - [facility] 5195971801  MRSA Hotline #:   810 047 3505  [facility] Resources  [facility] [place] [facility]  United Way [facility] Dept. [address]                 [address]         [address] [place] [address]  Blondell Reveal Phone:  [phone]                                  Phone:  [phone]                   Phone:  [phone]  [facility], [phone] - [phone]       -     [facility] in [place], [address]                                  [phone], Insurance  [place] Child Abuse Hotline (810) 230-4683 or 806-707-3813 (After Hours)   Behavioral Health Services  Substance Abuse Resources: - Alcohol and Drug Services  936-303-1254 - Addiction  [facility] [phone] - [facility] [place] [phone] Floydene Flock [phone] - Residential & Outpatient Substance Abuse Program  [phone]  Psychological Services: [facility]  [phone] Services  6066797619 - [facility], [phone] [place]. [address] [place], ACCESS LINE: 765-140-4211 or [phone], [employer]  Dental Assistance  If unable to pay or uninsured, contact:  [facility] or [facility]. to become qualified for the adult dental clinic.  Patients with Medicaid: [facility] 7756806843 [address], [phone] [address], [phone]  If unable to pay, or uninsured, contact [facility] (724)225-3559) or [facility] Department [phone] in [place], [phone] in [facility]) to become qualified for the adult dental clinic  Other Low-Cost Community Dental Services: - [facility]- [address] [place], [place], [place], [address], [phone],  [address], 2nd and 4th Thursday of the month at 6:30am.  10 clients each day by appointment, can sometimes see walk-in patients if someone does not show for an appointment. [facility]- [address] [place], [place], [address], [phone] - [facility]- [address] [place], [place], [address], [phone] - [facility]- [phone] [facility] Department- [phone] [facility] Department- [phone]

## 2012-04-14 NOTE — ED Notes (Signed)
To ED via self for eval of increasing BLE swelling x's 1wk. Reports resting with legs elevated over the weekend and worsening edema +1 on assessment in Bil[place] ankles and lower legs. Also c/o grief d/t loss of nephew via [place] over the weekend and needs to go to [place] to be with her sister but can't with health problem. Pt very tearful at bedside

## 2012-04-14 NOTE — ED Notes (Signed)
Pt tearful at bedside stating "I have an appointment at 11 and need to leave" Dr Preston Fleeting notified and stated he would come to her bedside and speak with her. Pt notified, she requested to have her monitor removed which was done.
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
## 2012-04-14 NOTE — ED Notes (Signed)
Pt did not have IV on arrival.
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
## 2012-04-16 ENCOUNTER — Encounter (HOSPITAL_COMMUNITY): Payer: Self-pay | Admitting: *Deleted

## 2012-05-16 ENCOUNTER — Emergency Department (HOSPITAL_COMMUNITY)
Admission: EM | Admit: 2012-05-16 | Discharge: 2012-05-16 | Disposition: A | Discharge status: Home / Self Care | Payer: Self-pay | Attending: Emergency Medicine | Admitting: Emergency Medicine

## 2012-05-16 ENCOUNTER — Encounter (HOSPITAL_COMMUNITY): Payer: Self-pay | Admitting: Emergency Medicine

## 2012-05-16 DIAGNOSIS — F411 Generalized anxiety disorder: Secondary | ICD-10-CM | POA: Insufficient documentation

## 2012-05-16 DIAGNOSIS — F419 Anxiety disorder, unspecified: Secondary | ICD-10-CM | POA: Insufficient documentation

## 2012-05-16 DIAGNOSIS — G8929 Other chronic pain: Secondary | ICD-10-CM | POA: Insufficient documentation

## 2012-05-16 DIAGNOSIS — F17200 Nicotine dependence, unspecified, uncomplicated: Secondary | ICD-10-CM | POA: Insufficient documentation

## 2012-05-16 DIAGNOSIS — Z8619 Personal history of other infectious and parasitic diseases: Secondary | ICD-10-CM | POA: Insufficient documentation

## 2012-05-16 HISTORY — DX: Anxiety disorder, unspecified: F41.9

## 2012-05-16 MED ORDER — ALPRAZOLAM 0.5 MG PO TABS: 0.5000 mg | ORAL_TABLET | Freq: Three times a day (TID) | ORAL | Status: DC | PRN

## 2012-05-16 NOTE — ED Provider Notes (Signed)
History  This chart was scribed for [employer]. Rubin Payor, MD by Erskine Emery. This patient was seen in room TR11C/TR11C and the patient's care was started at 14:40.   CSN: [id number]  Arrival date & time 05/16/12  1402   None     Chief Complaint  Patient presents with  . Anxiety  . Medication Refill    (Consider location/radiation/quality/duration/timing/severity/associated sxs/prior treatment) HPI Sydney Gonzales is a 56 y.o. female who presents to the Emergency Department complaining of panic attacks, triggered by stress, with associated insomnia, stammering, hand shaking, and headaches. Pt denies any weight loss, trouble breathing, chest pain, numbness, or weakness. Pt reports she came here about a month ago for the same complaint, was prescribed medication and takes them only when she needs them. Pt reports a long h/o depression and PTSD. Pt denies any h/o suicidal ideation or attempts of suicide. Pt reports she does smoke but doesn't drink. She claims her insurance doesn't kick in until the 15th, when she is scheduled to see a primary physician for more comprehensive care.    Past Medical History  Diagnosis Date  . Chronic back pain   . Migraine   . Chronic abdominal pain   . Hepatitis C   . Abdominal adhesions   . Anxiety     Past Surgical History  Procedure Date  . Gallbladder surgery   . Abdominal hysterectomy   . Appendectomy   . Cholecystectomy     History reviewed. No pertinent family history.  History  Substance Use Topics  . Smoking status: Current Everyday Smoker -- 0.5 packs/day  . Smokeless tobacco: Never Used  . Alcohol Use: No    OB History    Grav Para Term Preterm Abortions TAB SAB Ect Mult Living                  Review of Systems  Constitutional: Negative for fever, chills and appetite change.  Respiratory: Negative for chest tightness and shortness of breath.   Cardiovascular: Negative for chest pain.  Gastrointestinal: Negative for nausea and  vomiting.  Neurological: Positive for headaches. Negative for weakness and numbness.  Psychiatric/Behavioral: Positive for disturbed wake/sleep cycle. Negative for suicidal ideas. The patient is nervous/anxious.     Allergies  Stadol; Talwin; Toradol; Toradol; Morphine and related; Stadol; Talwin; and Morphine and related  Home Medications   Current Outpatient Rx  Name Route Sig Dispense Refill  . ALPRAZOLAM 0.5 MG PO TABS Oral Take 1 tablet (0.5 mg total) by mouth 3 (three) times daily as needed for sleep or anxiety. 10 tablet 0    BP 125/99  Pulse 51  Temp 98.1 F (36.7 C) (Oral)  Resp 16  SpO2 94%  Physical Exam  Nursing note and vitals reviewed. Constitutional: She is oriented to person, place, and time. She appears well-developed and well-nourished. No distress.  HENT:  Head: Normocephalic and atraumatic.  Eyes: EOM are normal.  Neck: Neck supple. No tracheal deviation present.  Cardiovascular: Normal rate.   Pulmonary/Chest: Effort normal. No respiratory distress.  Musculoskeletal: Normal range of motion.  Neurological: She is alert and oriented to person, place, and time.  Skin: Skin is warm and dry.  Psychiatric: She has a normal mood and affect. Her behavior is normal.    ED Course  Procedures (including critical care time) DIAGNOSTIC STUDIES: Oxygen Saturation is 94% on room air, adequate by my interpretation.    COORDINATION OF CARE: 14:52--I evaluated the patient and we discussed a treatment plan including  a refill of medication to which the pt agreed.    Labs Reviewed - No data to display No results found.   1. Anxiety       MDM   Patient requesting a refill of her anxiety medication. She states she has a history of PTSD and anxiety attacks. She was seen in the ER month ago for the same. She is without other complaints. She states her urinary symptoms cleared up. She states she has insurance starting on the 15th of on a primary care doctor then.  She's given a resource guide and some Xanax and will followup as needed.    I personally performed the services described in this documentation, which was scribed in my presence. The recorded information has been reviewed and considered.           Juliet Rude. Rubin Payor, MD 05/16/12 [phone]

## 2012-05-16 NOTE — ED Notes (Signed)
Pt c/o increased anxiety over last several days; pt sts out of Klonopin and requesting refill until can see PCP

## 2012-05-16 NOTE — ED Notes (Signed)
Pt states having depression and anxiety at home, states was given prescription for med earlier that helped her, insurance is available on the 15th and came to ED to get anti anxiety med

## 2012-05-25 ENCOUNTER — Encounter (HOSPITAL_COMMUNITY): Payer: Self-pay | Admitting: *Deleted

## 2012-05-25 ENCOUNTER — Emergency Department (HOSPITAL_COMMUNITY): Payer: Self-pay

## 2012-05-25 ENCOUNTER — Emergency Department (HOSPITAL_COMMUNITY)
Admission: EM | Admit: 2012-05-25 | Discharge: 2012-05-25 | Disposition: A | Discharge status: Home / Self Care | Payer: Self-pay | Attending: Emergency Medicine | Admitting: Emergency Medicine

## 2012-05-25 DIAGNOSIS — S93609A Unspecified sprain of unspecified foot, initial encounter: Secondary | ICD-10-CM | POA: Insufficient documentation

## 2012-05-25 DIAGNOSIS — Y92009 Unspecified place in unspecified non-institutional (private) residence as the place of occurrence of the external cause: Secondary | ICD-10-CM | POA: Insufficient documentation

## 2012-05-25 DIAGNOSIS — F17200 Nicotine dependence, unspecified, uncomplicated: Secondary | ICD-10-CM | POA: Insufficient documentation

## 2012-05-25 DIAGNOSIS — S40019A Contusion of unspecified shoulder, initial encounter: Secondary | ICD-10-CM | POA: Insufficient documentation

## 2012-05-25 DIAGNOSIS — G8929 Other chronic pain: Secondary | ICD-10-CM | POA: Insufficient documentation

## 2012-05-25 DIAGNOSIS — W010XXA Fall on same level from slipping, tripping and stumbling without subsequent striking against object, initial encounter: Secondary | ICD-10-CM | POA: Insufficient documentation

## 2012-05-25 DIAGNOSIS — W19XXXA Unspecified fall, initial encounter: Secondary | ICD-10-CM

## 2012-05-25 DIAGNOSIS — Z9089 Acquired absence of other organs: Secondary | ICD-10-CM | POA: Insufficient documentation

## 2012-05-25 DIAGNOSIS — F411 Generalized anxiety disorder: Secondary | ICD-10-CM | POA: Insufficient documentation

## 2012-05-25 DIAGNOSIS — M7989 Other specified soft tissue disorders: Secondary | ICD-10-CM | POA: Insufficient documentation

## 2012-05-25 DIAGNOSIS — S93601A Unspecified sprain of right foot, initial encounter: Secondary | ICD-10-CM

## 2012-05-25 DIAGNOSIS — R609 Edema, unspecified: Secondary | ICD-10-CM | POA: Insufficient documentation

## 2012-05-25 LAB — URINALYSIS, ROUTINE W REFLEX MICROSCOPIC
Glucose, UA: NEGATIVE mg/dL
Leukocytes, UA: NEGATIVE
Nitrite: NEGATIVE
Protein, ur: NEGATIVE mg/dL
pH: 7 (ref 5.0–8.0)

## 2012-05-25 MED ORDER — OXYCODONE-ACETAMINOPHEN 5-325 MG PO TABS
1.0000 | ORAL_TABLET | Freq: Once | ORAL | Status: AC
  Administered 2012-05-25: 1 via ORAL
  Filled 2012-05-25: qty 1

## 2012-05-25 MED ORDER — OXYCODONE-ACETAMINOPHEN 5-325 MG PO TABS: 1.0000 | ORAL_TABLET | Freq: Four times a day (QID) | ORAL | Status: DC | PRN

## 2012-05-25 NOTE — ED Notes (Signed)
[id number]<AV> Expected date:<BR> Expected time:<BR> Means of arrival:<BR> Comments:<BR> EMS/bilateral ankle pain &amp; swelling

## 2012-05-25 NOTE — ED Provider Notes (Signed)
History     CSN: [id number]  Arrival date & time 05/25/12  0001   First MD Initiated Contact with Patient 05/25/12 0305      Chief Complaint  Patient presents with  . Fall    (Consider location/radiation/quality/duration/timing/severity/associated sxs/prior treatment) Patient is a 56 y.o. female presenting with fall. The history is provided by the patient.  Fall Pertinent negatives include no abdominal pain and no hematuria.   patient states that she was at home and both her legs get weak and she fell after using the bathroom. She states she's had weakness in her legs since. She states the weakness is due to the pain from the fall. She also states that she hurts in her left shoulder. She states she's had swelling or legs. No loss of consciousness. Known head injury. She states she did bump her head but no headache or loss of consciousness. She states she has pain when she moves her left arm. She states she hurts in both of her legs also.  Past Medical History  Diagnosis Date  . Chronic back pain   . Migraine   . Chronic abdominal pain   . Hepatitis C   . Abdominal adhesions   . Anxiety     Past Surgical History  Procedure Date  . Gallbladder surgery   . Abdominal hysterectomy   . Appendectomy   . Cholecystectomy     History reviewed. No pertinent family history.  History  Substance Use Topics  . Smoking status: Current Everyday Smoker -- 0.5 packs/day  . Smokeless tobacco: Never Used  . Alcohol Use: No    OB History    Grav Para Term Preterm Abortions TAB SAB Ect Mult Living                  Review of Systems  Constitutional: Positive for fatigue.  Respiratory: Negative for cough, choking and shortness of breath.   Cardiovascular: Positive for leg swelling.  Gastrointestinal: Negative for abdominal pain and blood in stool.  Genitourinary: Negative for dysuria and hematuria.  Musculoskeletal: Positive for gait problem. Negative for joint swelling.    Neurological: Positive for weakness.  Psychiatric/Behavioral: Negative for agitation.    Allergies  Stadol; Talwin; Toradol; and Morphine and related  Home Medications   Current Outpatient Rx  Name Route Sig Dispense Refill  . ALPRAZOLAM 0.5 MG PO TABS Oral Take 1 tablet (0.5 mg total) by mouth 3 (three) times daily as needed for sleep or anxiety. 10 tablet 0  . OXYCODONE-ACETAMINOPHEN 5-325 MG PO TABS Oral Take 1-2 tablets by mouth every 6 (six) hours as needed for pain. 6 tablet 0    BP 104/61  Pulse 87  Temp 98.7 F (37.1 C) (Oral)  Resp 16  SpO2 97%  Physical Exam  Nursing note and vitals reviewed. Constitutional: She is oriented to person, place, and time. She appears well-developed and well-nourished.  HENT:  Head: Normocephalic and atraumatic.  Eyes: EOM are normal. Pupils are equal, round, and reactive to light.  Neck: Normal range of motion. Neck supple.  Cardiovascular: Normal rate, regular rhythm and normal heart sounds.   No murmur heard. Pulmonary/Chest: Effort normal and breath sounds normal. No respiratory distress. She has no wheezes. She has no rales.  Abdominal: Soft. Bowel sounds are normal. She exhibits no distension. There is no tenderness. There is no rebound and no guarding.  Musculoskeletal: She exhibits edema.       Bilateral low strength appearing edema. Tenderness over right foot.  No crepitance or deformity over bilateral lower legs. Mild tenderness to left shoulder. Range of motion is decreased eye pain. No crepitance or deformity. Neurovascularly intact distally. No lumbar tenderness.  Neurological: She is alert and oriented to person, place, and time. No cranial nerve deficit.  Skin: Skin is warm and dry.  Psychiatric: She has a normal mood and affect. Her speech is normal.    ED Course  Procedures (including critical care time)   Labs Reviewed  URINALYSIS, ROUTINE W REFLEX MICROSCOPIC   Dg Chest 2 View  05/25/2012  *RADIOLOGY REPORT*   Clinical Data: Fall, edema.  CHEST - 2 VIEW  Comparison: 04/14/2012  Findings: Heart size upper normal limits.  Mild central vascular congestion without overt edema.  No focal consolidation.  Aortic tortuosity, otherwise mediastinal contours within normal range.  No pleural effusion or pneumothorax.  Osteopenia and mild multilevel degenerative changes. Osseous density along the medial margin of the left humeral head.  IMPRESSION: Heart size upper normal with central vascular congestion. No overt edema or focal consolidation.  Osseous density along the medial margin of the left humeral head is favored to be a prominent osteophyte.  However, correlate clinically if concerned for fracture.  Original Report Authenticated By: Waneta Martins, M.D.   Dg Shoulder Left  05/25/2012  *RADIOLOGY REPORT*  Clinical Data: Left axilla pain status post fall.  LEFT SHOULDER - 2+ VIEW  Comparison: None.  Findings: Curvilinear calcific density along the medial margin of the left humeral head.  No dislocation.  IMPRESSION:  Calcification along the medial margin of the left humeral head is favored to reflect a prominent osteophyte. If clinical concern for a fracture persists, recommend a repeat radiograph in 5-10 days to evaluate for interval change or callus formation.  Original Report Authenticated By: Waneta Martins, M.D.   Dg Foot Complete Right  05/25/2012  *RADIOLOGY REPORT*  Clinical Data: Fall, foot and ankle swelling.  RIGHT FOOT COMPLETE - 3+ VIEW  Comparison: None.  Findings: Intact Lisfranc joint.  No displaced fracture or dislocation identified.  No aggressive osseous lesion. Limited ankle evaluation.  IMPRESSION: No acute osseous abnormality of the right foot identified. If clinical concern for a fracture persists, recommend a repeat radiograph in 5-10 days to evaluate for interval change or callus formation.  Original Report Authenticated By: Waneta Martins, M.D.     1. Fall   2. Right foot sprain     3. Shoulder contusion   4. Peripheral edema       MDM  Patient presents with fall. She states she had weakness in both legs. Laboratories overall reassuring. X-ray she did not show fracture in the foot. She'll follow up with her primary care as needed to check for occult fracture. X-ray of the chest and shoulder showed calcification along the humeral head. Likely osteophyte. She's not tender at this site she is more tender medially. She also has some peripheral edema and will followup her primary care Dr. She states she does have some Lasix at home but she has not been taking.        Juliet Rude. Rubin Payor, MD 05/25/12 [phone]

## 2012-05-25 NOTE — ED Notes (Signed)
Patient aware that ua sample is needed-pt unable to provide at this time.

## 2012-05-25 NOTE — ED Notes (Signed)
Pt states she is unable to give urine at this time 

## 2012-05-25 NOTE — ED Notes (Signed)
Pt noted to be crying upon admission and during initial assessment.

## 2012-05-25 NOTE — ED Notes (Signed)
Per EMS report; Pt from home: About 6 hours ago, after using the bathroom, pain and numbness in both legs causing pt to fall.  Pt was able to crawl back to her bed and waited.  Pt went to the bathroom again and then could not get up again.  EMS noted pt was able to ambulate to door to let EMS in for transport.  C/o of bilateral lower leg pain with swelling to leg and ankles.  Pain to her forehead to when she fell.  But no hematoma.  Pain in in left axillary area but no deformity noted.  Pt anxious, upset, and crying.  HR 118, BP 142/90, RR: 26. Hx of Hep C, migraines, anxiety, Recent UTI.  Pt does take Xananx when she needs it.

## 2012-05-25 NOTE — ED Notes (Signed)
Asked Pt if she was able to provide urine.  Pt still unable to at this time.

## 2012-05-28 ENCOUNTER — Emergency Department (HOSPITAL_COMMUNITY)
Admission: EM | Admit: 2012-05-28 | Discharge: 2012-05-28 | Disposition: A | Discharge status: Home / Self Care | Payer: Self-pay | Attending: Emergency Medicine | Admitting: Emergency Medicine

## 2012-05-28 ENCOUNTER — Encounter (HOSPITAL_COMMUNITY): Payer: Self-pay | Admitting: *Deleted

## 2012-05-28 DIAGNOSIS — B1920 Unspecified viral hepatitis C without hepatic coma: Secondary | ICD-10-CM | POA: Insufficient documentation

## 2012-05-28 DIAGNOSIS — F4310 Post-traumatic stress disorder, unspecified: Secondary | ICD-10-CM

## 2012-05-28 DIAGNOSIS — F411 Generalized anxiety disorder: Secondary | ICD-10-CM | POA: Insufficient documentation

## 2012-05-28 DIAGNOSIS — G8929 Other chronic pain: Secondary | ICD-10-CM | POA: Insufficient documentation

## 2012-05-28 DIAGNOSIS — F419 Anxiety disorder, unspecified: Secondary | ICD-10-CM

## 2012-05-28 MED ORDER — CLONAZEPAM 0.5 MG PO TABS
1.0000 mg | ORAL_TABLET | Freq: Once | ORAL | Status: AC
  Administered 2012-05-28: 1 mg via ORAL
  Filled 2012-05-28: qty 2

## 2012-05-28 MED ORDER — CLONAZEPAM 0.5 MG PO TABS: 1.0000 mg | ORAL_TABLET | Freq: Three times a day (TID) | ORAL | Status: DC | PRN

## 2012-05-28 NOTE — ED Notes (Signed)
Patient currently denies pain concerned and nervous of falling

## 2012-05-28 NOTE — Discharge Instructions (Signed)
 Anxiety and Panic Attacks Your caregiver has informed you that you are having an anxiety or panic attack. There may be many forms of this. Most of the time these attacks come suddenly and without warning. They come at any time of day, including periods of sleep, and at any time of life. They may be strong and unexplained. Although panic attacks are very scary, they are physically harmless. Sometimes the cause of your anxiety is not known. Anxiety is a protective mechanism of the body in its fight or flight mechanism. Most of these perceived danger situations are actually nonphysical situations (such as anxiety over losing a job). CAUSES  The causes of an anxiety or panic attack are many. Panic attacks may occur in otherwise healthy people given a certain set of circumstances. There may be a genetic cause for panic attacks. Some medications may also have anxiety as a side effect. SYMPTOMS  Some of the most common feelings are:  Intense terror.   Dizziness, feeling faint.   Hot and cold flashes.   Fear of going crazy.   Feelings that nothing is real.   Sweating.   Shaking.   Chest pain or a fast heartbeat (palpitations).   Smothering, choking sensations.   Feelings of impending doom and that death is near.   Tingling of extremities, this may be from over-breathing.   Altered reality (derealization).   Being detached from yourself (depersonalization).  Several symptoms can be present to make up anxiety or panic attacks. DIAGNOSIS  The evaluation by your caregiver will depend on the type of symptoms you are experiencing. The diagnosis of anxiety or panic attack is made when no physical illness can be determined to be a cause of the symptoms. TREATMENT  Treatment to prevent anxiety and panic attacks may include:  Avoidance of circumstances that cause anxiety.   Reassurance and relaxation.   Regular exercise.   Relaxation therapies, such as yoga.   Psychotherapy with a  psychiatrist or therapist.   Avoidance of caffeine, alcohol and illegal drugs.   Prescribed medication.  SEEK IMMEDIATE MEDICAL CARE IF:   You experience panic attack symptoms that are different than your usual symptoms.   You have any worsening or concerning symptoms.  Document Released: 10/01/2005 Document Revised: 09/20/2011 Document Reviewed: 02/02/2010 [facility] Patient Information 2012 [place], [place].    Post-Traumatic Stress Disorder If you have been diagnosed with post-traumatic stress disorder (PTSD), you have probably experienced a traumatic event in your life. These events are usually outside of the range of normal human experience and would negatively impact any normal person.  CAUSES  A person can get PTSD after living through or seeing a dangerous event such as:  An automobile accident.   War.   Natural disaster.   Rape.   Domestic violence.   Any event where there has been a threat to life.  PTSD is a real illness. PTSD Can happen to anyone at any age. Children get PTSD too. A doctor, or mental health professional with experience in treating PTSD can help you. SYMPTOMS  Not all symptoms may be present in any one person.  Distressing dreams.   Flashback: feeling the frightening event is happening again.   Avoiding activities, places, and people that remind you of the event.   Avoiding thoughts and feelings associated with the event.   Having frightening thoughts you cannot control.   Feeling on the edge with increased alertness and vigilance.   Trouble sleeping.   Feeling alone, detached from others.  Angry outbursts.   Feeling worried, guilty, or sad.   Having thoughts of hurting yourself or others.  PTSD may start soon after a frightening event or months or years later. Many war veterans have PTSD. Drinking alcohol or using drugs will not help PTSD and may even make it worse.  TREATMENT  PTSD can be treated. Treatment may include talk  therapy, medicine, or both. Either a doctor or a mental health professional who is experienced in treating PTSD can help you. Early diagnosis and treatment is best and can show more rapid improvement. Get help if you or a loved one are thinking of hurting yourself. Call your local emergency medical services if you need help immediately. Document Released: 06/26/2001 Document Revised: 09/20/2011 Document Reviewed: 06/09/2008 [facility] Patient Information 2012 [place], [place].    Narcotic and benzodiazepine use may cause drowsiness, slowed breathing or dependence.  Please use with caution and do not drive, operate machinery or watch young children alone while taking them.  Taking combinations of these medications or drinking alcohol will potentiate these effects.

## 2012-05-28 NOTE — ED Provider Notes (Signed)
History   This chart was scribed for Sydney Gonzales. Sydney Lamas, MD by Sydney Gonzales . The patient was seen in room TR05C/TR05C. Patient's care was started at 1210.    CSN: [id number]  Arrival date & time 05/28/12  1018   First MD Initiated Contact with Patient 05/28/12 1210      Chief Complaint  Patient presents with  . Fall  . Anxiety    (Consider location/radiation/quality/duration/timing/severity/associated sxs/prior treatment) HPI Sydney Gonzales is a 56 y.o. female who has a h/o anxiety presents to the Emergency Department complaining of constant, moderate anxiety with an onset of 3 days ago. Pt reports that she fell on Sunday in bathroom, when her feet became weak causing her to fall. Pt reports that since then, she has had anxiety with walking and going to the bathroom. Pt reports that she was seen here 3 days ago for the fall. Pt reports that she has mild right foot pain from the fall, that may be causing her legs to be weak. Pt denies any difficulty urinating. Pt reports that she has a f/u appointment with Dr. Hulen Skains for her anxiety. Pt reports that she has lives alone. Pt reports that she has taken 1 mg Clonopin in the past for her symptoms as needed. Pt reports a h/o PTSD and anxiety.   Past Medical History  Diagnosis Date  . Chronic back pain   . Migraine   . Chronic abdominal pain   . Hepatitis C   . Abdominal adhesions   . Anxiety     Past Surgical History  Procedure Date  . Gallbladder surgery   . Abdominal hysterectomy   . Appendectomy   . Cholecystectomy     History reviewed. No pertinent family history.  History  Substance Use Topics  . Smoking status: Current Everyday Smoker -- 0.5 packs/day  . Smokeless tobacco: Never Used  . Alcohol Use: No    OB History    Grav Para Term Preterm Abortions TAB SAB Ect Mult Living                  Review of Systems  Constitutional: Negative for chills.  Musculoskeletal: Negative for joint swelling.       Right foot  pain.   Neurological: Negative for weakness.  Psychiatric/Behavioral: The patient is nervous/anxious.   All other systems reviewed and are negative.    Allergies  Stadol; Talwin; Toradol; and Morphine and related  Home Medications   Current Outpatient Rx  Name Route Sig Dispense Refill  . ALPRAZOLAM 0.5 MG PO TABS Oral Take 1 tablet (0.5 mg total) by mouth 3 (three) times daily as needed for sleep or anxiety. 10 tablet 0    BP 136/73  Pulse 94  Temp 97.5 F (36.4 C) (Oral)  Resp 18  SpO2 97%  Physical Exam  Nursing note and vitals reviewed. Constitutional: She is oriented to person, place, and time. She appears well-developed and well-nourished. No distress.  HENT:  Head: Normocephalic and atraumatic.  Eyes: EOM are normal.  Neck: Neck supple. No tracheal deviation present.  Cardiovascular: Normal rate.   Pulmonary/Chest: Effort normal. No respiratory distress.  Musculoskeletal: Normal range of motion.  Neurological: She is alert and oriented to person, place, and time.  Reflex Scores:      Patellar reflexes are 1+ on the right side and 1+ on the left side.      1+ patellar reflexes bilaterally and symmetric. Gross sensation intact. Strength of lower extremities is 5/5.  Skin: Skin is warm and dry.  Psychiatric: Her behavior is normal. Her mood appears anxious.    ED Course  Procedures (including critical care time)  DIAGNOSTIC STUDIES: Oxygen Saturation is 97% on room air, normal by my interpretation.    COORDINATION OF CARE:  12:20-Discussed planned course of treatment with the patient, who is agreeable at this time.     Labs Reviewed - No data to display No results found.   1. Anxiety   2. PTSD (post-traumatic stress disorder)       MDM  I personally performed the services described in this documentation, which was scribed in my presence. The recorded information has been reviewed and considered.   Pt with exam here today excluding cauda equina,  able to stand and walk, distal sensation is intact, 1+ knee reflexes B.  Pt attributes to anxiety, has not her klonopin in some time.  Will give her refill and she has appt with her provider in the near future.        Sydney Gonzales. Sydney Mckellips, MD 06/02/12 1606

## 2012-05-28 NOTE — ED Notes (Signed)
Reports having a fall on Sunday, was seen here then. Having mild pain right foot and left arm but main complaint is severe anxiety about falling due to living alone.

## 2012-05-31 ENCOUNTER — Emergency Department (HOSPITAL_COMMUNITY)
Admission: EM | Admit: 2012-05-31 | Discharge: 2012-06-03 | Disposition: A | Discharge status: Other Acute Inpatient Hospital | Payer: Self-pay | Attending: Emergency Medicine | Admitting: Emergency Medicine

## 2012-05-31 ENCOUNTER — Encounter (HOSPITAL_COMMUNITY): Payer: Self-pay | Admitting: *Deleted

## 2012-05-31 DIAGNOSIS — F1910 Other psychoactive substance abuse, uncomplicated: Secondary | ICD-10-CM | POA: Insufficient documentation

## 2012-05-31 DIAGNOSIS — Z8619 Personal history of other infectious and parasitic diseases: Secondary | ICD-10-CM | POA: Insufficient documentation

## 2012-05-31 DIAGNOSIS — F329 Major depressive disorder, single episode, unspecified: Secondary | ICD-10-CM | POA: Insufficient documentation

## 2012-05-31 DIAGNOSIS — F3289 Other specified depressive episodes: Secondary | ICD-10-CM | POA: Insufficient documentation

## 2012-05-31 DIAGNOSIS — F411 Generalized anxiety disorder: Secondary | ICD-10-CM | POA: Insufficient documentation

## 2012-05-31 LAB — BASIC METABOLIC PANEL
BUN: 6 mg/dL (ref 6–23)
CO2: 31 mEq/L (ref 19–32)
Calcium: 9.4 mg/dL (ref 8.4–10.5)
Chloride: 101 mEq/L (ref 96–112)
Creatinine, Ser: 0.63 mg/dL (ref 0.50–1.10)
GFR calc Af Amer: >90 mL/min (ref >90)
GFR calc non Af Amer: >90 mL/min (ref >90)
Glucose, Bld: 117 mg/dL — ABNORMAL HIGH (ref 70–99)
Potassium: 3.8 mEq/L (ref 3.5–5.1)
Sodium: 140 mEq/L (ref 135–145)

## 2012-05-31 LAB — CBC WITH DIFFERENTIAL/PLATELET
Basophils Absolute: 0 10*3/uL (ref 0.0–0.1)
Basophils Relative: 0 % (ref 0–1)
Eosinophils Absolute: 0.2 10*3/uL (ref 0.0–0.7)
Eosinophils Relative: 3 % (ref 0–5)
HCT: 39.2 % (ref 36.0–46.0)
Hemoglobin: 12.9 g/dL (ref 12.0–15.0)
Lymphocytes Relative: 48 % — ABNORMAL HIGH (ref 12–46)
Lymphs Abs: 2.4 10*3/uL (ref 0.7–4.0)
MCH: 30.4 pg (ref 26.0–34.0)
MCHC: 32.9 g/dL (ref 30.0–36.0)
MCV: 92.2 fL (ref 78.0–100.0)
Monocytes Absolute: 0.7 10*3/uL (ref 0.1–1.0)
Monocytes Relative: 14 % — ABNORMAL HIGH (ref 3–12)
Neutro Abs: 1.7 10*3/uL (ref 1.7–7.7)
Neutrophils Relative %: 35 % — ABNORMAL LOW (ref 43–77)
Platelets: 162 10*3/uL (ref 150–400)
RBC: 4.25 MIL/uL (ref 3.87–5.11)
RDW: 14.4 % (ref 11.5–15.5)
WBC: 5 10*3/uL (ref 4.0–10.5)

## 2012-05-31 LAB — RAPID URINE DRUG SCREEN, HOSP PERFORMED
Amphetamines: NOT DETECTED
Benzodiazepines: POSITIVE — AB
Cocaine: NOT DETECTED
Opiates: NOT DETECTED
Tetrahydrocannabinol: NOT DETECTED

## 2012-05-31 LAB — ETHANOL: Alcohol, Ethyl (B): <11 mg/dL (ref 0–11)

## 2012-05-31 MED ORDER — LORAZEPAM 1 MG PO TABS
1.0000 mg | ORAL_TABLET | Freq: Four times a day (QID) | ORAL | Status: DC | PRN
  Administered 2012-06-01 – 2012-06-02 (×4): 1 mg via ORAL
  Filled 2012-05-31 (×4): qty 1

## 2012-05-31 MED ORDER — LORAZEPAM 2 MG/ML IJ SOLN: 1.0000 mg | Freq: Four times a day (QID) | INTRAMUSCULAR | Status: DC | PRN

## 2012-05-31 MED ORDER — VITAMIN B-1 100 MG PO TABS
100.0000 mg | ORAL_TABLET | Freq: Every day | ORAL | Status: DC
  Administered 2012-06-01 – 2012-06-03 (×3): 100 mg via ORAL
  Filled 2012-05-31 (×3): qty 1

## 2012-05-31 MED ORDER — THIAMINE HCL 100 MG/ML IJ SOLN: 100.0000 mg | Freq: Every day | INTRAMUSCULAR | Status: DC

## 2012-05-31 MED ORDER — ADULT MULTIVITAMIN W/MINERALS CH
1.0000 | ORAL_TABLET | Freq: Every day | ORAL | Status: DC
  Administered 2012-06-01 – 2012-06-03 (×3): 1 via ORAL
  Filled 2012-05-31 (×3): qty 1

## 2012-05-31 MED ORDER — FOLIC ACID 1 MG PO TABS
1.0000 mg | ORAL_TABLET | Freq: Every day | ORAL | Status: DC
  Administered 2012-06-01 – 2012-06-03 (×3): 1 mg via ORAL
  Filled 2012-05-31 (×3): qty 1

## 2012-05-31 NOTE — [facility] Counselor (Signed)
Pt has been declined due to methadone detox not offered at [facility] per yolanda, Assessment RN. Pt has been referred to [facility]. Pt is currenly pending disposition

## 2012-05-31 NOTE — ED Notes (Signed)
Pt presents to department for evaluation of depression and suicidal ideation. Pt states "I don't want to be here anymore." denies actual plan. States long term issues with depression/PTSD and hasn't been taking medications for several years. Crying upon arrival to exam room. Denies HI at the time. States R leg pain from recent fall, no obvious deformities noted. She is conscious alert and oriented x4. Pt has several bags of belongings, (3) brown bags, (1) pink bookbag and (3) grocery bags.

## 2012-05-31 NOTE — ED Notes (Signed)
Report to Chris RN.

## 2012-05-31 NOTE — Progress Notes (Signed)
Pt withdrawing with narcotics, feels anxious.  Ordered CIWA protocol so she could have doses of Ativan if needed.

## 2012-05-31 NOTE — ED Notes (Signed)
Patient tearful reassurance rendered

## 2012-05-31 NOTE — ED Provider Notes (Signed)
History     CSN: [id number]  Arrival date & time 05/31/12  1155   First MD Initiated Contact with Patient 05/31/12 1246      Chief Complaint  Patient presents with  . Medical Clearance  . Suicidal    (Consider location/radiation/quality/duration/timing/severity/associated sxs/prior treatment) HPI Patient presents emergency department with suicidal lots with depression and anxiety.  Patient states that she also would like her methadone detox.  Patient states, that she's had a lot of personal problems that led to this current state.  Patient denies chest pain, shortness of breath, hallucinations, weakness, nausea, vomiting, diarrhea, or abdominal pain.  Patient states that her condition is not worsened by anything other than her family members giving her a hard time. Past Medical History  Diagnosis Date  . Chronic back pain   . Migraine   . Chronic abdominal pain   . Hepatitis C   . Abdominal adhesions   . Anxiety     Past Surgical History  Procedure Date  . Gallbladder surgery   . Abdominal hysterectomy   . Appendectomy   . Cholecystectomy     No family history on file.  History  Substance Use Topics  . Smoking status: Current Everyday Smoker -- 0.5 packs/day  . Smokeless tobacco: Never Used  . Alcohol Use: No    OB History    Grav Para Term Preterm Abortions TAB SAB Ect Mult Living                  Review of Systems All other systems negative except as documented in the HPI. All pertinent positives and negatives as reviewed in the HPI.  Allergies  Stadol; Talwin; Toradol; and Morphine and related  Home Medications   Current Outpatient Rx  Name Route Sig Dispense Refill  . ALPRAZOLAM 0.5 MG PO TABS Oral Take 1 tablet (0.5 mg total) by mouth 3 (three) times daily as needed for sleep or anxiety. 10 tablet 0    BP 119/82  Pulse 74  Temp 96.1 F (35.6 C) (Oral)  Resp 20  SpO2 93%  Physical Exam  Constitutional: She is oriented to person, place, and  time. She appears well-developed and well-nourished.  HENT:  Head: Normocephalic and atraumatic.  Mouth/Throat: Oropharynx is clear and moist.  Neck: Normal range of motion. Neck supple.  Cardiovascular: Normal rate, regular rhythm and normal heart sounds.   No murmur heard. Pulmonary/Chest: Effort normal and breath sounds normal.  Neurological: She is alert and oriented to person, place, and time.  Skin: Skin is warm and dry. No rash noted.  Psychiatric: Her mood appears anxious. She is not actively hallucinating. She exhibits a depressed mood. She expresses suicidal ideation. She expresses no suicidal plans.    ED Course  Procedures (including critical care time)  Labs Reviewed  URINE RAPID DRUG SCREEN (HOSP PERFORMED) - Abnormal; Notable for the following:    Benzodiazepines POSITIVE (*)     All other components within normal limits  CBC WITH DIFFERENTIAL - Abnormal; Notable for the following:    Neutrophils Relative 35 (*)     Lymphocytes Relative 48 (*)     Monocytes Relative 14 (*)     All other components within normal limits  BASIC METABOLIC PANEL - Abnormal; Notable for the following:    Glucose, Bld 117 (*)     All other components within normal limits  ETHANOL    The ACT team will be consulted.  Patient is stable at this time  MDM  Carlyle Dolly, PA-C 05/31/12 1527
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
## 2012-05-31 NOTE — ED Notes (Signed)
Patient has $40.90 in cash. One credit card, and various ID's

## 2012-05-31 NOTE — ED Notes (Addendum)
Patient reports she has been having feelings of depression, si, and needing detox.  She has a Veterinary surgeon that she has been talking to for the past 1 week.  Patient is tearful in triage and states she just does not know now much longer she can hang on.  She reports she takes a large dose of methadone daily.   She denies any other drugs/etoh

## 2012-05-31 NOTE — ED Notes (Signed)
Report given to [place], Charity fundraiser. Pt moved to [facility].

## 2012-06-01 MED ORDER — ZOLPIDEM TARTRATE 5 MG PO TABS: 5.0000 mg | ORAL_TABLET | Freq: Every evening | ORAL | Status: DC | PRN

## 2012-06-01 MED ORDER — ALUM & MAG HYDROXIDE-SIMETH 200-200-20 MG/5ML PO SUSP
30.0000 mL | ORAL | Status: DC | PRN
  Administered 2012-06-02: 30 mL via ORAL
  Filled 2012-06-01: qty 30

## 2012-06-01 MED ORDER — ACETAMINOPHEN 325 MG PO TABS
650.0000 mg | ORAL_TABLET | ORAL | Status: DC | PRN
  Filled 2012-06-01: qty 2

## 2012-06-01 MED ORDER — ONDANSETRON HCL 8 MG PO TABS
4.0000 mg | ORAL_TABLET | Freq: Three times a day (TID) | ORAL | Status: DC | PRN
  Administered 2012-06-02: 4 mg via ORAL
  Filled 2012-06-01: qty 1

## 2012-06-01 MED ORDER — LORAZEPAM 1 MG PO TABS: 1.0000 mg | ORAL_TABLET | Freq: Three times a day (TID) | ORAL | Status: DC | PRN

## 2012-06-01 NOTE — ED Notes (Signed)
Patient complains of feeling anxious and craving the Methadone. Ativan 1 mg given PRN.
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
## 2012-06-01 NOTE — ED Notes (Signed)
Report to Kim RN

## 2012-06-01 NOTE — ED Notes (Signed)
Patient up to the bathroom ambulates without problems

## 2012-06-01 NOTE — ED Provider Notes (Addendum)
Medical screening examination/treatment/procedure(s) were performed by non-physician practitioner and as supervising physician I was immediately available for consultation/collaboration.  Toy Baker, MD 06/01/12 0752  9:02 AM Pt awaiting placement. Vitals stable. Pt comfortable  Toy Baker, MD 06/01/12 [phone]

## 2012-06-01 NOTE — ED Notes (Signed)
Morning medications given... Patient up to shower.

## 2012-06-02 MED ORDER — IBUPROFEN 200 MG PO TABS
400.0000 mg | ORAL_TABLET | Freq: Four times a day (QID) | ORAL | Status: DC | PRN
  Administered 2012-06-02 (×2): 400 mg via ORAL
  Filled 2012-06-02 (×2): qty 2

## 2012-06-02 NOTE — ED Notes (Signed)
Pt laying down in bed. Respirations even and regular. Skin warm, dry, intact. Reports abdominal pain in left upper quadrant. It's a "cramping pain". Denies any other pain. A.O. X 3. NAD. Sitter at bedside. Environment safe.
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
## 2012-06-02 NOTE — ED Notes (Signed)
Patient states she can not take tylenol due to having Hep C. Patient states she takes Motrin at home. EDP advised and gave verbal order with readback for Motrin 400mg  Q6 PRN.

## 2012-06-02 NOTE — ED Notes (Signed)
Pt laying down in bed. Tearful. Reporting "pain all over". Unable to specify or quantify pain. Restless and agitated. Given PRN dose of Ativan.

## 2012-06-02 NOTE — ED Notes (Signed)
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

## 2012-06-02 NOTE — ED Notes (Signed)
Sleeping. Sitter at [facility]. 

## 2012-06-02 NOTE — ED Provider Notes (Addendum)
Have Sydney Gonzales is a 64 your old female had some suicidal ideation but no plans basically present for detox from methadone. She's been declined by behavioral health because they do not provide methadone detox. Patient is still currently under review by the act team for appropriate placement. Patient has been stable today in ED.      Sydney Jakes, MD 06/02/12 1704  Patient known to me from yesterday. She's been accepted at [facility] for a substance abuse treatment patient will be discharged from here. She will go there by bus.    Sydney Jakes, MD 06/03/12 1022

## 2012-06-03 NOTE — ED Notes (Signed)
Patient denies SI and has signed a No Harm Contract.

## 2012-06-03 NOTE — BH Assessment (Signed)
Assessment Note  Update:  Received call from [place] at 0900 stating pt accepted to [facility] to Dr. Deneen Harts.  Updated EDP Zackowski and ED staff.  Called CSW Frederico Hamman, to see if pt could be transported via taxi, as pt denies SI/HI and so that pt would not have to be made IVC to transport.  Pt is voluntary for methadone detox.  Taxi voucher obtained by CSW and given to nurse for pt to be transported to [place] at 10:45.  Updated assessment disposition, completed assessment notification and faxed to [facility] to log.     Disposition:  Disposition Disposition of Patient: Inpatient treatment program Type of inpatient treatment program: Adult Patient referred to: Other (Comment) (Pt accepted [facility])  On Site Evaluation by:   Reviewed with Physician:  Delorse Limber, Rennis Harding 06/03/2012 10:33 AM

## 2012-06-03 NOTE — ED Notes (Signed)
Breakfast ordered @ 02:50-8/20, spoke w/Phylissa 

## 2012-06-05 NOTE — ED Notes (Signed)
CSW was contacted by ACT to assist getting Pt to [facility] for a voluntary detox admission from benzos.  Pt experiencing multiple detox symptoms and is unable to secure a ride at this time d/t [place] being out of state. Pt has had multiple related ED admissions in the last 30 days.  Pt is unable to navigate PART system d/t unfamiliarity and difficulty concentrating secondary to detox at this time. Pt's safety and tx motivation is best supported by use of taxi at this time. Pt agreeable. Nursing staff aware and able to assist Pt to taxi at [place]. [facility] aware that Pt will be arriving via taxi.    Frederico Hamman, LCSW [phone]

## 2012-06-15 ENCOUNTER — Emergency Department (HOSPITAL_COMMUNITY)
Admission: EM | Admit: 2012-06-15 | Discharge: 2012-06-15 | Disposition: A | Discharge status: Home / Self Care | Payer: Self-pay | Attending: Emergency Medicine | Admitting: Emergency Medicine

## 2012-06-15 ENCOUNTER — Encounter (HOSPITAL_COMMUNITY): Payer: Self-pay | Admitting: Emergency Medicine

## 2012-06-15 DIAGNOSIS — F411 Generalized anxiety disorder: Secondary | ICD-10-CM | POA: Insufficient documentation

## 2012-06-15 DIAGNOSIS — F17200 Nicotine dependence, unspecified, uncomplicated: Secondary | ICD-10-CM | POA: Insufficient documentation

## 2012-06-15 DIAGNOSIS — G8929 Other chronic pain: Secondary | ICD-10-CM | POA: Insufficient documentation

## 2012-06-15 DIAGNOSIS — F329 Major depressive disorder, single episode, unspecified: Secondary | ICD-10-CM | POA: Insufficient documentation

## 2012-06-15 DIAGNOSIS — F3289 Other specified depressive episodes: Secondary | ICD-10-CM | POA: Insufficient documentation

## 2012-06-15 DIAGNOSIS — B1920 Unspecified viral hepatitis C without hepatic coma: Secondary | ICD-10-CM | POA: Insufficient documentation

## 2012-06-15 DIAGNOSIS — F4310 Post-traumatic stress disorder, unspecified: Secondary | ICD-10-CM | POA: Insufficient documentation

## 2012-06-15 MED ORDER — ALPRAZOLAM 0.5 MG PO TABS: 0.5000 mg | ORAL_TABLET | Freq: Three times a day (TID) | ORAL | Status: DC | PRN

## 2012-06-15 NOTE — ED Notes (Signed)
Pt alert, arrives from home, c/o depression, recent family stressors, resp even unlabored, skin pwd, denies SI/HI
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
## 2012-06-16 ENCOUNTER — Encounter (HOSPITAL_COMMUNITY): Payer: Self-pay | Admitting: Emergency Medicine

## 2012-06-16 ENCOUNTER — Emergency Department (HOSPITAL_COMMUNITY)
Admission: EM | Admit: 2012-06-16 | Discharge: 2012-06-16 | Disposition: A | Discharge status: Home / Self Care | Payer: No Typology Code available for payment source | Attending: Emergency Medicine | Admitting: Emergency Medicine

## 2012-06-16 ENCOUNTER — Emergency Department (HOSPITAL_COMMUNITY): Payer: Self-pay

## 2012-06-16 DIAGNOSIS — M549 Dorsalgia, unspecified: Secondary | ICD-10-CM | POA: Insufficient documentation

## 2012-06-16 DIAGNOSIS — F411 Generalized anxiety disorder: Secondary | ICD-10-CM | POA: Insufficient documentation

## 2012-06-16 DIAGNOSIS — G8929 Other chronic pain: Secondary | ICD-10-CM | POA: Insufficient documentation

## 2012-06-16 DIAGNOSIS — M25519 Pain in unspecified shoulder: Secondary | ICD-10-CM | POA: Insufficient documentation

## 2012-06-16 DIAGNOSIS — R109 Unspecified abdominal pain: Secondary | ICD-10-CM | POA: Insufficient documentation

## 2012-06-16 DIAGNOSIS — F17200 Nicotine dependence, unspecified, uncomplicated: Secondary | ICD-10-CM | POA: Insufficient documentation

## 2012-06-16 MED ORDER — TRAMADOL HCL 50 MG PO TABS
50.0000 mg | ORAL_TABLET | Freq: Once | ORAL | Status: AC
  Administered 2012-06-16: 50 mg via ORAL
  Filled 2012-06-16: qty 1

## 2012-06-16 MED ORDER — TRAMADOL HCL 50 MG PO TABS: 50.0000 mg | ORAL_TABLET | Freq: Four times a day (QID) | ORAL | Status: DC | PRN

## 2012-06-16 MED ORDER — ALPRAZOLAM 0.5 MG PO TABS
1.0000 mg | ORAL_TABLET | Freq: Once | ORAL | Status: AC
  Administered 2012-06-16: 1 mg via ORAL
  Filled 2012-06-16: qty 2
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
## 2012-06-18 NOTE — ED Provider Notes (Signed)
Medical screening examination/treatment/procedure(s) were performed by non-physician practitioner and as supervising physician I was immediately available for consultation/collaboration.    Frenchie Dangerfield R Tammie Yanda, MD 06/18/12 1100 

## 2012-06-21 ENCOUNTER — Emergency Department (HOSPITAL_COMMUNITY)
Admission: EM | Admit: 2012-06-21 | Discharge: 2012-06-23 | Disposition: A | Discharge status: Home / Self Care | Payer: Self-pay | Attending: Emergency Medicine | Admitting: Emergency Medicine

## 2012-06-21 ENCOUNTER — Encounter (HOSPITAL_COMMUNITY): Payer: Self-pay

## 2012-06-21 DIAGNOSIS — F329 Major depressive disorder, single episode, unspecified: Secondary | ICD-10-CM

## 2012-06-21 DIAGNOSIS — F32A Depression, unspecified: Secondary | ICD-10-CM

## 2012-06-21 DIAGNOSIS — F3289 Other specified depressive episodes: Secondary | ICD-10-CM | POA: Insufficient documentation

## 2012-06-21 DIAGNOSIS — R45851 Suicidal ideations: Secondary | ICD-10-CM

## 2012-06-21 LAB — CBC WITH DIFFERENTIAL/PLATELET
Basophils Absolute: 0 10*3/uL (ref 0.0–0.1)
Basophils Relative: 0 % (ref 0–1)
Eosinophils Absolute: 0.2 10*3/uL (ref 0.0–0.7)
Eosinophils Relative: 3 % (ref 0–5)
HCT: 41.3 % (ref 36.0–46.0)
MCH: 30.2 pg (ref 26.0–34.0)
MCHC: 33.2 g/dL (ref 30.0–36.0)
Monocytes Absolute: 0.7 10*3/uL (ref 0.1–1.0)
Monocytes Relative: 13 % — ABNORMAL HIGH (ref 3–12)
Neutro Abs: 1.6 10*3/uL — ABNORMAL LOW (ref 1.7–7.7)
RDW: 14.1 % (ref 11.5–15.5)

## 2012-06-21 LAB — COMPREHENSIVE METABOLIC PANEL
ALT: 36 U/L — ABNORMAL HIGH (ref 0–35)
AST: 61 U/L — ABNORMAL HIGH (ref 0–37)
Alkaline Phosphatase: 99 U/L (ref 39–117)
CO2: 29 mEq/L (ref 19–32)
Chloride: 103 mEq/L (ref 96–112)
GFR calc Af Amer: >90 mL/min (ref >90)
GFR calc non Af Amer: >90 mL/min (ref >90)
Glucose, Bld: 95 mg/dL (ref 70–99)
Potassium: 3.9 mEq/L (ref 3.5–5.1)
Sodium: 139 mEq/L (ref 135–145)
Total Bilirubin: 0.3 mg/dL (ref 0.3–1.2)

## 2012-06-21 LAB — RAPID URINE DRUG SCREEN, HOSP PERFORMED
Barbiturates: NOT DETECTED
Benzodiazepines: POSITIVE — AB

## 2012-06-21 LAB — ACETAMINOPHEN LEVEL: Acetaminophen (Tylenol), Serum: <15 ug/mL (ref 10–30)

## 2012-06-21 MED ORDER — LOPERAMIDE HCL 2 MG PO CAPS: 2.0000 mg | ORAL_CAPSULE | ORAL | Status: DC | PRN
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
## 2012-06-21 NOTE — ED Notes (Signed)
PT's personal items placed in locker # 10

## 2012-06-21 NOTE — ED Notes (Signed)
ACT member in room 

## 2012-06-21 NOTE — ED Notes (Signed)
Spoke with candy rn AC house coverage and on sitter list.
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
## 2012-06-21 NOTE — ED Notes (Signed)
Report received from Audrey RN.

## 2012-06-21 NOTE — ED Notes (Signed)
Pt sleeping.  No vitlas at this time.  Sitter at door.

## 2012-06-21 NOTE — ED Notes (Signed)
Diet sprite provided  

## 2012-06-22 MED ORDER — ONDANSETRON HCL 8 MG PO TABS
4.0000 mg | ORAL_TABLET | Freq: Three times a day (TID) | ORAL | Status: DC | PRN
  Administered 2012-06-22: 4 mg via ORAL
  Filled 2012-06-22: qty 2

## 2012-06-22 MED ORDER — LORAZEPAM 1 MG PO TABS: 1.0000 mg | ORAL_TABLET | Freq: Three times a day (TID) | ORAL | Status: DC | PRN

## 2012-06-22 MED ORDER — ACETAMINOPHEN 325 MG PO TABS
650.0000 mg | ORAL_TABLET | ORAL | Status: DC | PRN
  Administered 2012-06-22: 650 mg via ORAL
  Filled 2012-06-22: qty 2

## 2012-06-22 MED ORDER — NICOTINE 21 MG/24HR TD PT24: 21.0000 mg | MEDICATED_PATCH | Freq: Every day | TRANSDERMAL | Status: DC

## 2012-06-22 MED ORDER — ALUM & MAG HYDROXIDE-SIMETH 200-200-20 MG/5ML PO SUSP
30.0000 mL | ORAL | Status: DC | PRN
  Administered 2012-06-22: 30 mL via ORAL
  Filled 2012-06-22: qty 30

## 2012-06-22 MED ORDER — ZOLPIDEM TARTRATE 5 MG PO TABS
5.0000 mg | ORAL_TABLET | Freq: Every evening | ORAL | Status: DC | PRN
  Administered 2012-06-22: 5 mg via ORAL
  Filled 2012-06-22: qty 1

## 2012-06-22 NOTE — ED Notes (Signed)
Sitter at [facility], alert, NAD, calm, resting in bed.

## 2012-06-22 NOTE — ED Notes (Signed)
Sitter relieved for restroom break. 

## 2012-06-22 NOTE — [facility] Counselor (Signed)
Cm spoke with patient about her methadone use; pt states that she does not want to discontinue her use of methadone; pt was declined at [facility] no availability; will fax referrals to [facility] and [facility]

## 2012-06-22 NOTE — ED Provider Notes (Signed)
Filed Vitals:   06/22/12 0615  BP: 123/76  Pulse: 66  Temp: 98.4 F (36.9 C)  Resp: 18   Sydney Gonzales is a 56 y.o. female with SI, methadone Rx, under review for placement.  C/o legs throbbing similar to WD sx - she has had these symptoms with withdrawal before.  No leg swelling, no calf pain.  Jones Skene, M.D. 9:03 AM 06/22/2012   Jones Skene, MD 06/22/12 1610

## 2012-06-22 NOTE — ED Notes (Signed)
No change, intermitantly resting/sleeping, NAD, calm, sitter at [facility].

## 2012-06-22 NOTE — ED Notes (Signed)
Decaf coffee provided   

## 2012-06-22 NOTE — ED Notes (Signed)
Sitter relieved for lunch. 
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
## 2012-06-22 NOTE — Progress Notes (Signed)
Patient declined at [facility] by Jorje Guild, PA, due to exclusion criteria. Patient requires methadone maintenance treatment. Per ACT, patient refusing methadone detox. Jorje Guild, PA recommends current plan to refer patient to [facility]. Rosey Bath, RN

## 2012-06-23 MED ORDER — IBUPROFEN 200 MG PO TABS
600.0000 mg | ORAL_TABLET | Freq: Once | ORAL | Status: DC
  Filled 2012-06-23: qty 3

## 2012-06-23 NOTE — Progress Notes (Signed)
Ready to be released.  No longer suicidal.  Wants to go to methadone clinic.

## 2012-06-23 NOTE — Progress Notes (Signed)
7:45 AM Asleep when I rounded.  Waiting placement for detox from methadone, presented with depression and some suicidal ideation several days ago.

## 2012-06-23 NOTE — ED Notes (Signed)
Social Worker at bedside.

## 2012-06-23 NOTE — ED Notes (Addendum)
Resting, not sleeping, snacking, no changes, sitter at [facility].

## 2012-06-23 NOTE — ED Notes (Signed)
Per Social worker patient will be discharged

## 2012-06-23 NOTE — ED Notes (Signed)
No changes, sitter at BS. 

## 2012-06-23 NOTE — ED Notes (Signed)
Resting in bed in dark, no changes, sitter at BS. 

## 2012-06-23 NOTE — ED Notes (Addendum)
Pt states that she wants to leave to get medication from her methadone clinic. Paged assessment team regarding patient's request/status at this time. ED MD made aware. Spoke to [employer] health department who advised how to take out IVC papers if needed.

## 2012-06-23 NOTE — ED Notes (Signed)
EDMD at bedside
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
## 2012-06-23 NOTE — ED Notes (Signed)
Sleeping, sitter at Bs, no changes.

## 2012-08-06 NOTE — ED Provider Notes (Signed)
History     CSN: [id number]  Arrival date & time 06/21/12  1543   First MD Initiated Contact with Patient 06/21/12 1719      Chief Complaint  Patient presents with  . Suicidal  . Depression  . Medication Refill    (Consider location/radiation/quality/duration/timing/severity/associated sxs/prior treatment) HPI Comments: Patient presents with depression, suicidal ideation.  Recent change in her dose of methadone has made her feel worse.    Patient is a 56 y.o. female presenting with mental health disorder. The history is provided by the patient.  Mental Health Problem The primary symptoms include dysphoric mood. The current episode started this week. This is a recurrent problem.  The onset of the illness is precipitated by drug abuse and a stressful event. The degree of incapacity that she is experiencing as a consequence of her illness is moderate. Sequelae of the illness include an inability to work and harmed interpersonal relations. Additional symptoms of the illness include anhedonia and feelings of worthlessness. She admits to suicidal ideas. She does not have a plan to commit suicide. She contemplates harming herself. She has not already injured self. She does not contemplate injuring another person. She has not already  injured another person.    Past Medical History  Diagnosis Date  . Chronic back pain   . Migraine   . Chronic abdominal pain   . Hepatitis C   . Abdominal adhesions   . Anxiety     Past Surgical History  Procedure Date  . Gallbladder surgery   . Abdominal hysterectomy   . Appendectomy   . Cholecystectomy     No family history on file.  History  Substance Use Topics  . Smoking status: Current Every Day Smoker -- 0.5 packs/day  . Smokeless tobacco: Never Used  . Alcohol Use: No    OB History    Grav Para Term Preterm Abortions TAB SAB Ect Mult Living                  Review of Systems  Psychiatric/Behavioral: Positive for dysphoric mood.    All other systems reviewed and are negative.    Allergies  Stadol; Talwin; Toradol; and Morphine and related  Home Medications   Current Outpatient Rx  Name Route Sig Dispense Refill  . METHADONE HCL PO Oral Take 50 mg by mouth daily.      BP 94/67  Pulse 56  Temp 97.5 F (36.4 C) (Oral)  Resp 14  SpO2 96%  Physical Exam  Nursing note and vitals reviewed. Constitutional: She is oriented to person, place, and time. She appears well-developed and well-nourished. No distress.  HENT:  Head: Normocephalic and atraumatic.  Neck: Normal range of motion. Neck supple.  Cardiovascular: Normal rate and regular rhythm.  Exam reveals no gallop and no friction rub.   No murmur heard. Pulmonary/Chest: Effort normal and breath sounds normal. No respiratory distress. She has no wheezes.  Abdominal: Soft. Bowel sounds are normal. She exhibits no distension. There is no tenderness.  Musculoskeletal: Normal range of motion.  Neurological: She is alert and oriented to person, place, and time.  Skin: Skin is warm and dry. She is not diaphoretic.    ED Course  Procedures (including critical care time)  Labs Reviewed  CBC WITH DIFFERENTIAL - Abnormal; Notable for the following:    Neutrophils Relative 32 (*)     Neutro Abs 1.6 (*)     Lymphocytes Relative 51 (*)     Monocytes Relative 13 (*)  All other components within normal limits  URINE RAPID DRUG SCREEN (HOSP PERFORMED) - Abnormal; Notable for the following:    Benzodiazepines POSITIVE (*)     All other components within normal limits  COMPREHENSIVE METABOLIC PANEL - Abnormal; Notable for the following:    AST 61 (*)     ALT 36 (*)     All other components within normal limits  ETHANOL  ACETAMINOPHEN LEVEL  LAB REPORT - SCANNED   No results found.   1. Depression   2. Suicidal ideation       MDM   Will consult act for eval.        Geoffery Lyons, MD 08/06/12 1306

## 2016-03-16 ENCOUNTER — Emergency Department (HOSPITAL_COMMUNITY): Payer: Self-pay

## 2016-03-16 ENCOUNTER — Emergency Department (HOSPITAL_COMMUNITY)
Admission: EM | Admit: 2016-03-16 | Discharge: 2016-03-17 | Disposition: A | Discharge status: Other Acute Inpatient Hospital | Payer: No Typology Code available for payment source | Attending: Emergency Medicine | Admitting: Emergency Medicine

## 2016-03-16 DIAGNOSIS — R1084 Generalized abdominal pain: Secondary | ICD-10-CM

## 2016-03-16 DIAGNOSIS — Z79899 Other long term (current) drug therapy: Secondary | ICD-10-CM | POA: Insufficient documentation

## 2016-03-16 DIAGNOSIS — F419 Anxiety disorder, unspecified: Secondary | ICD-10-CM

## 2016-03-16 DIAGNOSIS — F314 Bipolar disorder, current episode depressed, severe, without psychotic features: Secondary | ICD-10-CM

## 2016-03-16 DIAGNOSIS — F17200 Nicotine dependence, unspecified, uncomplicated: Secondary | ICD-10-CM | POA: Insufficient documentation

## 2016-03-16 DIAGNOSIS — F322 Major depressive disorder, single episode, severe without psychotic features: Secondary | ICD-10-CM | POA: Insufficient documentation

## 2016-03-16 LAB — COMPREHENSIVE METABOLIC PANEL
ALBUMIN: 3.9 g/dL (ref 3.5–5.0)
ALK PHOS: 57 U/L (ref 38–126)
ALT: 29 U/L (ref 14–54)
AST: 34 U/L (ref 15–41)
Anion gap: 6 (ref 5–15)
BILIRUBIN TOTAL: 0.2 mg/dL — AB (ref 0.3–1.2)
BUN: 15 mg/dL (ref 6–20)
CALCIUM: 9.2 mg/dL (ref 8.9–10.3)
CO2: 24 mmol/L (ref 22–32)
CREATININE: 0.9 mg/dL (ref 0.44–1.00)
Chloride: 107 mmol/L (ref 101–111)
GFR calc Af Amer: >60 mL/min (ref >60)
GFR calc non Af Amer: >60 mL/min (ref >60)
GLUCOSE: 99 mg/dL (ref 65–99)
Potassium: 4.4 mmol/L (ref 3.5–5.1)
SODIUM: 137 mmol/L (ref 135–145)
TOTAL PROTEIN: 7.2 g/dL (ref 6.5–8.1)

## 2016-03-16 LAB — CBC
HCT: 38.8 % (ref 36.0–46.0)
Hemoglobin: 12.7 g/dL (ref 12.0–15.0)
MCH: 30 pg (ref 26.0–34.0)
MCHC: 32.7 g/dL (ref 30.0–36.0)
MCV: 91.5 fL (ref 78.0–100.0)
PLATELETS: 157 10*3/uL (ref 150–400)
RBC: 4.24 MIL/uL (ref 3.87–5.11)
RDW: 15.2 % (ref 11.5–15.5)
WBC: 5.1 10*3/uL (ref 4.0–10.5)

## 2016-03-16 LAB — LIPASE, BLOOD: Lipase: 24 U/L (ref 11–51)

## 2016-03-16 MED ORDER — IOPAMIDOL (ISOVUE-300) INJECTION 61%
100.0000 mL | Freq: Once | INTRAVENOUS | Status: AC | PRN
  Administered 2016-03-16: 100 mL via INTRAVENOUS

## 2016-03-16 MED ORDER — ONDANSETRON HCL 4 MG/2ML IJ SOLN
4.0000 mg | Freq: Once | INTRAMUSCULAR | Status: AC | PRN
  Administered 2016-03-16: 4 mg via INTRAVENOUS
  Filled 2016-03-16: qty 2

## 2016-03-16 MED ORDER — FENTANYL CITRATE (PF) 100 MCG/2ML IJ SOLN
50.0000 ug | Freq: Once | INTRAMUSCULAR | Status: AC
  Administered 2016-03-16: 50 ug via INTRAVENOUS
  Filled 2016-03-16: qty 2

## 2016-03-16 MED ORDER — DIATRIZOATE MEGLUMINE & SODIUM 66-10 % PO SOLN: 15.0000 mL | ORAL | Status: DC | PRN

## 2016-03-16 NOTE — ED Notes (Signed)
Patient complaining of abdominal pain Right upper quandrant x2 days. Patient has had nausea and vomiting. Patient has a hx of bowel obstruction and that this is what happened last time. Patient has had difficulty moving her bowels.

## 2016-03-16 NOTE — ED Notes (Signed)
RN starting IV, drawing labs 
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
## 2016-03-16 NOTE — ED Notes (Signed)
Social work at bedside.  
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
## 2016-03-17 ENCOUNTER — Encounter (HOSPITAL_COMMUNITY): Payer: Self-pay | Admitting: *Deleted

## 2016-03-17 ENCOUNTER — Inpatient Hospital Stay (HOSPITAL_COMMUNITY)
Admission: AD | Admit: 2016-03-17 | Discharge: 2016-03-21 | DRG: 885 | Disposition: A | Discharge status: Home / Self Care | Payer: No Typology Code available for payment source | Source: Intra-hospital | Attending: Psychiatry | Admitting: Psychiatry

## 2016-03-17 DIAGNOSIS — M549 Dorsalgia, unspecified: Secondary | ICD-10-CM | POA: Diagnosis present

## 2016-03-17 DIAGNOSIS — G8929 Other chronic pain: Secondary | ICD-10-CM | POA: Diagnosis present

## 2016-03-17 DIAGNOSIS — F17200 Nicotine dependence, unspecified, uncomplicated: Secondary | ICD-10-CM | POA: Diagnosis present

## 2016-03-17 DIAGNOSIS — R45851 Suicidal ideations: Secondary | ICD-10-CM | POA: Diagnosis present

## 2016-03-17 DIAGNOSIS — F410 Panic disorder [episodic paroxysmal anxiety] without agoraphobia: Secondary | ICD-10-CM | POA: Diagnosis present

## 2016-03-17 DIAGNOSIS — B1920 Unspecified viral hepatitis C without hepatic coma: Secondary | ICD-10-CM | POA: Diagnosis present

## 2016-03-17 DIAGNOSIS — G4700 Insomnia, unspecified: Secondary | ICD-10-CM | POA: Diagnosis present

## 2016-03-17 DIAGNOSIS — F314 Bipolar disorder, current episode depressed, severe, without psychotic features: Principal | ICD-10-CM | POA: Diagnosis present

## 2016-03-17 DIAGNOSIS — F411 Generalized anxiety disorder: Secondary | ICD-10-CM | POA: Diagnosis present

## 2016-03-17 LAB — URINALYSIS, ROUTINE W REFLEX MICROSCOPIC
Bilirubin Urine: NEGATIVE
GLUCOSE, UA: NEGATIVE mg/dL
Hgb urine dipstick: NEGATIVE
Ketones, ur: NEGATIVE mg/dL
NITRITE: NEGATIVE
PH: 6.5 (ref 5.0–8.0)
Protein, ur: NEGATIVE mg/dL
SPECIFIC GRAVITY, URINE: 1.039 — AB (ref 1.005–1.030)

## 2016-03-17 LAB — I-STAT BETA HCG BLOOD, ED (MC, WL, AP ONLY): I-stat hCG, quantitative: <5 m[IU]/mL (ref <5)

## 2016-03-17 LAB — URINE MICROSCOPIC-ADD ON

## 2016-03-17 MED ORDER — ONDANSETRON 4 MG PO TBDP: 4.0000 mg | ORAL_TABLET | Freq: Three times a day (TID) | ORAL | Status: DC | PRN

## 2016-03-17 MED ORDER — HYDROXYZINE HCL 25 MG PO TABS
25.0000 mg | ORAL_TABLET | Freq: Four times a day (QID) | ORAL | Status: DC | PRN
  Filled 2016-03-17: qty 10

## 2016-03-17 MED ORDER — ACETAMINOPHEN 325 MG PO TABS: 650.0000 mg | ORAL_TABLET | Freq: Four times a day (QID) | ORAL | Status: DC | PRN

## 2016-03-17 MED ORDER — CITALOPRAM HYDROBROMIDE 10 MG PO TABS
10.0000 mg | ORAL_TABLET | Freq: Every day | ORAL | Status: DC
  Filled 2016-03-17 (×4): qty 1

## 2016-03-17 MED ORDER — HYDROXYZINE HCL 25 MG PO TABS: 25.0000 mg | ORAL_TABLET | Freq: Four times a day (QID) | ORAL | Status: DC | PRN

## 2016-03-17 MED ORDER — DICYCLOMINE HCL 10 MG/ML IM SOLN
20.0000 mg | Freq: Once | INTRAMUSCULAR | Status: AC
  Administered 2016-03-17: 20 mg via INTRAMUSCULAR
  Filled 2016-03-17: qty 2

## 2016-03-17 MED ORDER — QUETIAPINE FUMARATE 100 MG PO TABS
100.0000 mg | ORAL_TABLET | Freq: Every day | ORAL | Status: DC
  Administered 2016-03-17 – 2016-03-20 (×4): 100 mg via ORAL
  Filled 2016-03-17: qty 1
  Filled 2016-03-17: qty 7
  Filled 2016-03-17 (×5): qty 1

## 2016-03-17 MED ORDER — BUSPIRONE HCL 10 MG PO TABS
10.0000 mg | ORAL_TABLET | Freq: Three times a day (TID) | ORAL | Status: DC
  Administered 2016-03-18 – 2016-03-21 (×10): 10 mg via ORAL
  Filled 2016-03-17: qty 21
  Filled 2016-03-17: qty 1
  Filled 2016-03-17: qty 21
  Filled 2016-03-17 (×9): qty 1
  Filled 2016-03-17: qty 21
  Filled 2016-03-17 (×4): qty 1

## 2016-03-17 MED ORDER — MAGNESIUM HYDROXIDE 400 MG/5ML PO SUSP: 30.0000 mL | Freq: Every day | ORAL | Status: DC | PRN

## 2016-03-17 MED ORDER — CITALOPRAM HYDROBROMIDE 10 MG PO TABS
10.0000 mg | ORAL_TABLET | Freq: Every day | ORAL | Status: DC
  Administered 2016-03-17: 10 mg via ORAL
  Filled 2016-03-17: qty 1

## 2016-03-17 MED ORDER — BUSPIRONE HCL 10 MG PO TABS
10.0000 mg | ORAL_TABLET | Freq: Three times a day (TID) | ORAL | Status: DC
  Administered 2016-03-17: 10 mg via ORAL
  Filled 2016-03-17: qty 1

## 2016-03-17 MED ORDER — FENTANYL CITRATE (PF) 100 MCG/2ML IJ SOLN
50.0000 ug | Freq: Once | INTRAMUSCULAR | Status: AC
  Administered 2016-03-17: 50 ug via INTRAVENOUS
  Filled 2016-03-17: qty 2

## 2016-03-17 MED ORDER — ALUM & MAG HYDROXIDE-SIMETH 200-200-20 MG/5ML PO SUSP
30.0000 mL | ORAL | Status: DC | PRN
  Filled 2016-03-17: qty 30

## 2016-03-17 MED ORDER — DIATRIZOATE MEGLUMINE & SODIUM 66-10 % PO SOLN: 15.0000 mL | ORAL | Status: DC | PRN

## 2016-03-17 MED ORDER — QUETIAPINE FUMARATE 100 MG PO TABS
100.0000 mg | ORAL_TABLET | Freq: Every day | ORAL | Status: DC
Start: 2016-03-17 — End: 2016-03-17

## 2016-03-17 NOTE — ED Notes (Signed)
Patient and belongings wanded by security.  
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
## 2016-03-17 NOTE — ED Notes (Signed)
Patient reports suicidal ideations with a plan to jump into traffic. Patient denies HI and AVH at this time. Plan of care discussed. Patient voices no complaints or concerns this time. Encouragement and support provided and safety maintain. Q 15 min safety checks in place.

## 2016-03-17 NOTE — ED Notes (Signed)
Patient dressing out into scrubs.
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
## 2016-03-17 NOTE — Progress Notes (Signed)
Writer entered patients room and observed her lying in bed resting. She received her scheduled hs medication. She was offered a snack because her dinner was not eaten from earlier. She has been in bed asleep, did not attend group or come to the dayroom tonight. She reports passive si and verbally contracts for safety, denies hi/a/v hallucinations. Support given and safety maintained on unit with 15 min checks. Fluids given.

## 2016-03-17 NOTE — ED Notes (Signed)
Report called and security called to wand patient and belongings.

## 2016-03-17 NOTE — BH Assessment (Signed)
Assessment completed. Consulted Maryjean Mornharles Kober, PA-C who agrees that pt meets inpatient criteria. Informed Antony MaduraKelly Humes, PA-C of the recommendation.
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
## 2016-03-17 NOTE — ED Notes (Signed)
Verbal from Ekal[place]Kelly, [place] 20MG  IM bentyl and 50mcg IV fentanyl.

## 2016-03-18 DIAGNOSIS — R45851 Suicidal ideations: Secondary | ICD-10-CM

## 2016-03-18 DIAGNOSIS — F314 Bipolar disorder, current episode depressed, severe, without psychotic features: Principal | ICD-10-CM

## 2016-03-18 LAB — TSH: TSH: 0.462 u[IU]/mL (ref 0.350–4.500)

## 2016-03-18 MED ORDER — CLONAZEPAM 0.5 MG PO TABS
0.5000 mg | ORAL_TABLET | Freq: Two times a day (BID) | ORAL | Status: DC | PRN
  Administered 2016-03-18 – 2016-03-19 (×2): 0.5 mg via ORAL
  Filled 2016-03-18 (×2): qty 1

## 2016-03-18 NOTE — [facility] Group Notes (Signed)
[facility] Group Notes:  (Nursing/MHT/Case Management/Adjunct)  Date:  03/18/2016  Time:  11:00 AM Type of Therapy:  Psychoeducational Skills  Participation Level:  Did Not Attend  Participation Quality:  DID NOT ATTEND  Affect:  DID NOT ATTEND  Cognitive:  DID NOT ATTEND  Insight:  None  Engagement in Group:  DID NOT ATTEND  Modes of Intervention:  DID NOT ATTEND  Summary of Progress/Problems: Pt did not attend patient self inventory group.    Bethann PunchesJane O Guillaume Weninger 03/18/2016, 11:00 AM

## 2016-03-18 NOTE — Progress Notes (Signed)
Psychoeducational Group Note  Date:  03/18/2016 Time:  0022  Group Topic/Focus:  Wrap-Up Group:   The focus of this group is to help patients review their daily goal of treatment and discuss progress on daily workbooks.  Participation Level: Did Not Attend  Participation Quality:  Not Applicable  Affect:  Not Applicable  Cognitive:  Not Applicable  Insight:  Not Applicable  Engagement in Group: Not Applicable  Additional Comments:  The patient did not attend group.   Kmari Brian S 03/18/2016, 12:22 AM

## 2016-03-18 NOTE — Progress Notes (Signed)
DAR NOTE: Pt present with flat affect and depressed mood in the unit. Pt has been isolating herself and has been in bed most of the time. Pt denies physical pain, but complained of being dizzy, fluid encouraged, meds given as scheduled. As per self inventory, pt refused to feel it. Pt rate depression at 8, hopeless ness at 8, and anxiety at 9. Pt's safety ensured with 15 minute and environmental checks. Pt currently denies SI/HI and A/V hallucinations. Pt verbally agrees to seek staff if SI/HI or A/VH occurs and to consult with staff before acting on these thoughts. Will continue POC.

## 2016-03-18 NOTE — H&P (Signed)
Psychiatric Admission Assessment Adult  Patient Identification: Sydney Gonzales MRN:  [id number] Date of Evaluation:  03/18/2016 Chief Complaint:  Major Depressive Disorder, single episode severe without psychosis Principal Diagnosis: Bipolar affective disorder, depressed, severe ([place]) Diagnosis:   Patient Active Problem List   Diagnosis Date Noted  . Bipolar affective disorder, depressed, severe ([place]) [F31.4] 03/17/2016  . Anxiety [F41.9]    History of Present Illness:Per HPI-60 y.o. female reporting suicidal ideations with a plan to jump into traffic. Pt stated "I want to do something quick and get it over with". "It seem like it would be better if I wasn't here". Pt reported that she has attempted suicide in the past but did not report any self-injurious behaviors. Pt denies HI and AVH at this time. PT reported that she is currently receiving mental health treatment for bipolar/depression and shared that she has been off of her medication for approximately one month. Pt stated "he won't let me take them". Pt is endorsing multiple depressive symptoms and shared that her sleep and appetite has decreased. PT reported seeing a shadow over her bed at night prior to falling asleep. Pt did not report any drug or alcohol abuse at this time. Associated Signs/Symptoms: Depression Symptoms:  depressed mood, feelings of worthlessness/guilt, difficulty concentrating, suicidal thoughts without plan, anxiety, panic attacks, (Hypo) Manic Symptoms:  Impulsivity, Irritable Mood, Anxiety Symptoms:  Excessive Worry, Social Anxiety, Psychotic Symptoms:  Hallucinations: None PTSD Symptoms: Avoidance:  Decreased Interest/Participation Foreshortened Future Total Time spent with patient: 30 minutes  Past Psychiatric History: See Above  Is the patient at risk to self? Yes.    Has the patient been a risk to self in the past 6 months? Yes.    Has the patient been a risk to self within the distant past? Yes.     Is the patient a risk to others? No.  Has the patient been a risk to others in the past 6 months? No.  Has the patient been a risk to others within the distant past? No.   Prior Inpatient Therapy:   Prior Outpatient Therapy:    Alcohol Screening: 1. How often do you have a drink containing alcohol?: Never 9. Have you or someone else been injured as a result of your drinking?: No 10. Has a relative or friend or a doctor or another health worker been concerned about your drinking or suggested you cut down?: No Alcohol Use Disorder Identification Test Final Score (AUDIT): 0 Brief Intervention: AUDIT score less than 7 or less-screening does not suggest unhealthy drinking-brief intervention not indicated Substance Abuse History in the last 12 months:  No. Consequences of Substance Abuse: NA Previous Psychotropic Medications: No  Psychological Evaluations: No  Past Medical History:  Past Medical History  Diagnosis Date  . Chronic back pain   . Migraine   . Chronic abdominal pain   . Hepatitis C   . Abdominal adhesions   . Anxiety     Past Surgical History  Procedure Laterality Date  . Gallbladder surgery    . Abdominal hysterectomy    . Appendectomy    . Cholecystectomy     Family History: History reviewed. No pertinent family history. Family Psychiatric  History: See Above Tobacco Screening: '@FLOW'$ ([phone])::1)@ Social History:  History  Alcohol Use No     History  Drug Use No    Additional Social History:      Pain Medications: none Prescriptions: none Over the Counter: none History of alcohol / drug use?: No history of  alcohol / drug abuse                    Allergies:   Allergies  Allergen Reactions  . Stadol [Butorphanol Tartrate] Shortness Of Breath    Migraine  . Talwin [Pentazocine] Shortness Of Breath    migraine  . Toradol [Ketorolac Tromethamine] Anaphylaxis and Shortness Of Breath  . Morphine And Related Itching and Rash   Lab Results:   Results for orders placed or performed during the hospital encounter of 03/16/16 (from the past 48 hour(s))  Lipase, blood     Status: None   Collection Time: 03/16/16  9:20 PM  Result Value Ref Range   Lipase 24 11 - 51 U/L  Comprehensive metabolic panel     Status: Abnormal   Collection Time: 03/16/16  9:20 PM  Result Value Ref Range   Sodium 137 135 - 145 mmol/L   Potassium 4.4 3.5 - 5.1 mmol/L   Chloride 107 101 - 111 mmol/L   CO2 24 22 - 32 mmol/L   Glucose, Bld 99 65 - 99 mg/dL   BUN 15 6 - 20 mg/dL   Creatinine, Ser 0.90 0.44 - 1.00 mg/dL   Calcium 9.2 8.9 - 10.3 mg/dL   Total Protein 7.2 6.5 - 8.1 g/dL   Albumin 3.9 3.5 - 5.0 g/dL   AST 34 15 - 41 U/L   ALT 29 14 - 54 U/L   Alkaline Phosphatase 57 38 - 126 U/L   Total Bilirubin 0.2 (L) 0.3 - 1.2 mg/dL   GFR calc non Af Amer >60 >60 mL/min   GFR calc Af Amer >60 >60 mL/min    Comment: (NOTE) The eGFR has been calculated using the CKD EPI equation. This calculation has not been validated in all clinical situations. eGFR's persistently <60 mL/min signify possible Chronic Kidney Disease.    Anion gap 6 5 - 15  CBC     Status: None   Collection Time: 03/16/16  9:20 PM  Result Value Ref Range   WBC 5.1 4.0 - 10.5 K/uL   RBC 4.24 3.87 - 5.11 MIL/uL   Hemoglobin 12.7 12.0 - 15.0 g/dL   HCT 38.8 36.0 - 46.0 %   MCV 91.5 78.0 - 100.0 fL   MCH 30.0 26.0 - 34.0 pg   MCHC 32.7 30.0 - 36.0 g/dL   RDW 15.2 11.5 - 15.5 %   Platelets 157 150 - 400 K/uL  Urinalysis, Routine w reflex microscopic     Status: Abnormal   Collection Time: 03/17/16  2:04 AM  Result Value Ref Range   Color, Urine YELLOW YELLOW   APPearance CLEAR CLEAR   Specific Gravity, Urine 1.039 (H) 1.005 - 1.030   pH 6.5 5.0 - 8.0   Glucose, UA NEGATIVE NEGATIVE mg/dL   Hgb urine dipstick NEGATIVE NEGATIVE   Bilirubin Urine NEGATIVE NEGATIVE   Ketones, ur NEGATIVE NEGATIVE mg/dL   Protein, ur NEGATIVE NEGATIVE mg/dL   Nitrite NEGATIVE NEGATIVE    Leukocytes, UA SMALL (A) NEGATIVE  Urine microscopic-add on     Status: Abnormal   Collection Time: 03/17/16  2:04 AM  Result Value Ref Range   Squamous Epithelial / LPF 0-5 (A) NONE SEEN   WBC, UA 6-30 0 - 5 WBC/hpf   RBC / HPF 0-5 0 - 5 RBC/hpf   Bacteria, UA RARE (A) NONE SEEN  I-Stat beta hCG blood, ED     Status: None   Collection Time: 03/17/16  2:46 AM  Result Value Ref Range   I-stat hCG, quantitative <5.0 <5 mIU/mL   Comment 3            Comment:   GEST. AGE      CONC.  (mIU/mL)   <=1 WEEK        5 - 50     2 WEEKS       50 - 500     3 WEEKS       100 - 10,000     4 WEEKS     1,000 - 30,000        FEMALE AND NON-PREGNANT FEMALE:     LESS THAN 5 mIU/mL     Blood Alcohol level:  Lab Results  Component Value Date   [facility] <11 06/21/2012   ETH <11 95/62/1308    Metabolic Disorder Labs:  No results found for: HGBA1C, MPG No results found for: PROLACTIN No results found for: CHOL, TRIG, HDL, CHOLHDL, VLDL, LDLCALC  Current Medications: Current Facility-Administered Medications  Medication Dose Route Frequency Provider Last Rate Last Dose  . acetaminophen (TYLENOL) tablet 650 mg  650 mg Oral Q6H PRN Patrecia Pour, NP      . alum & mag hydroxide-simeth (MAALOX/MYLANTA) 200-200-20 MG/5ML suspension 30 mL  30 mL Oral Q4H PRN Patrecia Pour, NP      . busPIRone (BUSPAR) tablet 10 mg  10 mg Oral TID Patrecia Pour, NP   10 mg at 03/18/16 1149  . citalopram (CELEXA) tablet 10 mg  10 mg Oral Daily Patrecia Pour, NP   10 mg at 03/18/16 0818  . hydrOXYzine (ATARAX/VISTARIL) tablet 25 mg  25 mg Oral Q6H PRN Patrecia Pour, NP      . magnesium hydroxide (MILK OF MAGNESIA) suspension 30 mL  30 mL Oral Daily PRN Patrecia Pour, NP      . ondansetron (ZOFRAN-ODT) disintegrating tablet 4 mg  4 mg Oral Q8H PRN Patrecia Pour, NP      . QUEtiapine (SEROQUEL) tablet 100 mg  100 mg Oral QHS Patrecia Pour, NP   100 mg at 03/17/16 2136   PTA Medications: Prescriptions prior to admission   Medication Sig Dispense Refill Last Dose  . oxyCODONE-acetaminophen (PERCOCET/ROXICET) 5-325 MG tablet Take 1 tablet by mouth every 6 (six) hours as needed for severe pain. Reported on 03/17/2016   unknown at Unknown time    Musculoskeletal: Strength & Muscle Tone: within normal limits Gait & Station: normal Patient leans: N/A  Psychiatric Specialty Exam: Physical Exam  Nursing note and vitals reviewed. Constitutional: She is oriented to person, place, and time. She appears well-developed.  Musculoskeletal: Normal range of motion.  Neurological: She is oriented to person, place, and time.  Skin: Skin is dry.  Psychiatric: She has a normal mood and affect. Her behavior is normal.    Review of Systems  Psychiatric/Behavioral: Positive for depression and suicidal ideas. Negative for hallucinations. The patient is nervous/anxious and has insomnia.   All other systems reviewed and are negative.   Blood pressure 112/83, pulse 86, temperature 98 F (36.7 C), temperature source Oral, resp. rate 18, height '5\' 6"'$  (1.676 m), weight 90.719 kg (200 lb).Body mass index is 32.3 kg/(m^2).  General Appearance: Guarded  Eye Contact:  Fair  Speech:  Clear and Coherent  Volume:  Normal  Mood:  Depressed  Affect:  Constricted  Thought Process:  Coherent  Orientation:  Full (Time, Place, and Person)  Thought Content:  Hallucinations:  None  Suicidal Thoughts:  Yes.  with intent/plan  Homicidal Thoughts:  No  Memory:  Immediate;   Fair Recent;   Fair Remote;   Fair  Judgement:  Fair  Insight:  Fair  Psychomotor Activity:  Normal  Concentration:  Concentration: Fair  Recall:  [employer] of Knowledge:  Fair  Language:  Good  Akathisia:  No  Handed:  Right  AIMS (if indicated):     Assets:  Desire for Improvement Social Support  ADL's:  Intact  Cognition:  WNL  Sleep:  Number of Hours: 6.75    I agree with current treatment plan on 03/18/2016, Patient seen face-to-face for psychiatric  evaluation follow-up, chart reviewed and case discussed with the MD Afreen. Reviewed the information documented and agree with the treatment plan.  Treatment Plan Summary: Daily contact with patient to assess and evaluate symptoms and progress in treatment and Medication management Continue with Celexa 10 mg,Buspar 10 mg PO TIDfor mood stabilization. Continue with Seroquel 100 mg for insomnia/ mood stabilization Start Klonopin 0.'5mg'$  PO. BID for increase anxiety  Will continue to monitor vitals ,medication compliance and treatment side effects while patient is here.  Reviewed labs Glucose 101 elevated ,BAL - 0, UDS - pending labs  CSW will start working on disposition.  Patient to participate in therapeutic milieu  Observation Level/Precautions:  15 minute checks  Laboratory:  CBC Chemistry Profile UDS UA  Psychotherapy:  Individual and group session  Medications:  See Above  Consultations:  Psychiatry  Discharge Concerns:  Safety, stabilization, and risk of access to medication and medication stabilization   Estimated GPC:6-1PELG  Other:     I certify that inpatient services furnished can reasonably be expected to improve the patient's condition.    Derrill Center, NP 6/4/20172:35 PM Patient seen face to face for psychiatric evaluation. Chart reviewed and finding discussed with Physician extender. Agreed with disposition and treatment plan.   Berniece Andreas, MD
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
## 2016-03-18 NOTE — Progress Notes (Signed)
Psychoeducational Group Note  Date:  03/18/2016 Time:  2144  Group Topic/Focus:  Wrap-Up Group:   The focus of this group is to help patients review their daily goal of treatment and discuss progress on daily workbooks.  Participation Level: Did Not Attend  Participation Quality:  Not Applicable  Affect:  Not Applicable  Cognitive:  Not Applicable  Insight:  Not Applicable  Engagement in Group: Not Applicable  Additional Comments:  The patient did not attend group this evening and elected to sleep.  Hazle CocaGOODMAN, Corinthian Kemler S 03/18/2016, 9:45 PM

## 2016-03-18 NOTE — Progress Notes (Signed)
Patient came to nursing station to request anxiety medication during group. Writer spoke with her 1:1 and inquired about her complaints of feeling dizzy earlier on day shift and she reports that she is not sure what is causing it. Writer re-educated her on fall precautions and encouraged her to address this with her doctor on tomorrow and Clinical research associatewriter will inform on coming nurse. Writer encouraged her to attend groups when possible since she has not attended any since being here. She reports that she doesn't like being around a lot of people. Writer informed her that if she does attend that she does not have to share unless she wants to. She was given a prn klonopin and snacks before she returned to her room. Support given and safety maintained on unit with 15 min checks.

## 2016-03-18 NOTE — BHH Group Notes (Signed)
B[facility] Group Notes: (Clinical Social Work)   03/18/2016      Type of Therapy:  Group Therapy   Participation Level:  Did Not Attend despite MHT prompting   Ambrose MantleMareida Grossman-Orr, LCSW 03/18/2016, 4:30 PM

## 2016-03-19 LAB — LIPID PANEL
CHOL/HDL RATIO: 3.2 ratio
CHOLESTEROL: 172 mg/dL (ref 0–200)
HDL: 54 mg/dL (ref >40)
LDL Cholesterol: 99 mg/dL (ref 0–99)
TRIGLYCERIDES: 96 mg/dL (ref <150)
VLDL: 19 mg/dL (ref 0–40)

## 2016-03-19 MED ORDER — QUETIAPINE FUMARATE 25 MG PO TABS
25.0000 mg | ORAL_TABLET | Freq: Every morning | ORAL | Status: DC
  Administered 2016-03-20 – 2016-03-21 (×2): 25 mg via ORAL
  Filled 2016-03-19: qty 7
  Filled 2016-03-19 (×4): qty 1

## 2016-03-19 MED ORDER — CLONAZEPAM 0.5 MG PO TABS
0.5000 mg | ORAL_TABLET | Freq: Three times a day (TID) | ORAL | Status: DC | PRN
  Administered 2016-03-19 – 2016-03-21 (×4): 0.5 mg via ORAL
  Filled 2016-03-19 (×4): qty 1

## 2016-03-19 MED ORDER — PAROXETINE HCL 20 MG PO TABS
20.0000 mg | ORAL_TABLET | Freq: Every day | ORAL | Status: DC
  Administered 2016-03-19 – 2016-03-21 (×3): 20 mg via ORAL
  Filled 2016-03-19: qty 1
  Filled 2016-03-19: qty 7
  Filled 2016-03-19 (×4): qty 1

## 2016-03-19 NOTE — Tx Team (Signed)
Interdisciplinary Treatment Plan Update (Adult) Date: 03/19/2016   Date: 03/19/2016 2:53 PM  Progress in Treatment:  Attending groups: No  Participating in groups: No  Taking medication as prescribed: Yes  Tolerating medication: Yes  Family/Significant othe contact made: No, Pt declines Patient understands diagnosis: Yes AEB by seeking help with depression Discussing patient identified problems/goals with staff: Yes  Medical problems stabilized or resolved: Yes  Denies suicidal/homicidal ideation: Yes Patient has not harmed self or Others: Yes   New problem(s) identified: None identified at this time.   Discharge Plan or Barriers: None identified at this time.   Additional comments:  Patient and CSW reviewed pt's identified goals and treatment plan. Patient verbalized understanding and agreed to treatment plan. CSW reviewed [facility] "Discharge Process and Patient Involvement" Form. Pt verbalized understanding of information provided and signed form.   Reason for Continuation of Hospitalization:  Anxiety Depression Medication stabilization Suicidal ideation  Estimated length of stay: 3-5 days  Review of initial/current patient goals per problem list:   1.  Goal(s): Patient will participate in aftercare plan  Met:  No  Target date: 3-5 days from date of admission   As evidenced by: Patient will participate within aftercare plan AEB aftercare provider and housing plan at discharge being identified.  03/19/16: CSW to work with Pt to assess for appropriate discharge plan and faciliate appointments and referrals as needed prior to d/c.  2.  Goal (s): Patient will exhibit decreased depressive symptoms and suicidal ideations.  Met:  No  Target date: 3-5 days from date of admission   As evidenced by: Patient will utilize self rating of depression at 3 or below and demonstrate decreased signs of depression or be deemed stable for discharge by MD. 03/19/16: Pt was admitted with symptoms of  depression, rating 10/10. Pt continues to present with flat affect and depressive symptoms.  Pt will demonstrate decreased symptoms of depression and rate depression at 3/10 or lower prior to discharge.  3.  Goal(s): Patient will demonstrate decreased signs and symptoms of anxiety.  Met:  No  Target date: 3-5 days from date of admission   As evidenced by: Patient will utilize self rating of anxiety at 3 or below and demonstrated decreased signs of anxiety, or be deemed stable for discharge by MD 03/19/16: Pt was admitted with increased levels of anxiety and is currently rating those symptoms highly. Pt will demonstrated decreased symptoms of anxiety and rate it at 3/10 prior to d/c.  Attendees:  Patient:    Family:    Physician: Dr. Parke Poisson, MD  03/19/2016 2:53 PM  Nursing: Lars Pinks, RN Case manager  03/19/2016 2:53 PM  Clinical Social Worker Peri Maris, [place] 03/19/2016 2:53 PM  Other: Tilden Fossa, [place] 03/19/2016 2:53 PM  Clinical: Eulogio Bear, RN 03/19/2016 2:53 PM  Other: , RN Charge Nurse 03/19/2016 2:53 PM  Other: Hilda Lias, [place], [facility] Social Work [phone]

## 2016-03-19 NOTE — Progress Notes (Signed)
D:Patient in bed on approach.  Patient states he day was ok.  Patient rated depression 9/10 and anxiety 7/10.  Patient states these are about the same as they were yesterday.  Patient does appear to have a depressed affect and a anxious mood. Patient denies SI/HI and denies AVH. A: Staff to monitor Q 15 mins for safety.  Encouragement and support offered.  Scheduled medications administered per orders. R: Patient remains safe on the unit.  Patient did not attend group tonight.  Patient visible on the unit for snacks tonight.  Patient taking administered medications

## 2016-03-19 NOTE — Progress Notes (Signed)
Adult Psychoeducational Group Note  Date:  03/19/2016 Time:  8:45 PM  Group Topic/Focus:  Wrap-Up Group:   The focus of this group is to help patients review their daily goal of treatment and discuss progress on daily workbooks.  Participation Level:  Did Not Attend  Participation Quality:  Did not attend  Affect:  Did not attend  Cognitive:  Did not attend  Insight: None  Engagement in Group:  Did not attend  Modes of Intervention:  Did not attend  Additional Comments:  Patient did not attend wrap up group tonight.   Melanye Hiraldo L Aala Ransom 03/19/2016, 8:45 PM

## 2016-03-19 NOTE — Progress Notes (Signed)
[facility] MD Progress Note  03/19/2016 4:38 PM Sydney Gonzales  MRN:  [id number] Subjective:   Patient reports she is still feeling depressed, although better than prior to admission. At this time her major focus pertains to medication issues. She states she feels that Paxil trial in the past was much more effective than Celexa, and had taken it in combination with Seroquel and Buspar which she does feel are effective . At this time denies suicidal ideations. Objective : I have reviewed case with treatment team and have met with patient . She is a 60 year old female, who has been diagnosed with Bipolar Disorder, also describes history of Anxiety. Presented to ED with depression, suicidal ideations, in the context of being off her psychiatric medications x one month. As noted, currently partially improved, but still depressed, sad. Currently able to contract for safety and denies current suicidal plan or intentio/contracts for safety on unit. States Paxil trial was well tolerated and effective for her in the past, wants to restart it . No disruptive or agitated behaviors on the unit , but has tended to isolate, with limited milieu ,group participation. Labs - Lipid panel is unremarkable, and TSH WNL  Principal Problem: Bipolar affective disorder, depressed, severe ([place]) Diagnosis:   Patient Active Problem List   Diagnosis Date Noted  . Bipolar affective disorder, depressed, severe ([place]) [F31.4] 03/17/2016  . Anxiety [F41.9]    Total Time spent with patient: 20 minutes    Past Medical History:  Past Medical History  Diagnosis Date  . Chronic back pain   . Migraine   . Chronic abdominal pain   . Hepatitis C   . Abdominal adhesions   . Anxiety     Past Surgical History  Procedure Laterality Date  . Gallbladder surgery    . Abdominal hysterectomy    . Appendectomy    . Cholecystectomy     Family History: History reviewed. No pertinent family history.  Social History:  History  Alcohol Use  No     History  Drug Use No    Social History   Social History  . Marital Status: Divorced    Spouse Name: N/A  . Number of Children: N/A  . Years of Education: N/A   Social History Main Topics  . Smoking status: Current Every Day Smoker -- 0.50 packs/day  . Smokeless tobacco: Never Used  . Alcohol Use: No  . Drug Use: No  . Sexual Activity: Not Asked   Other Topics Concern  . None   Social History Narrative   ** Merged History Encounter **       Additional Social History:    Pain Medications: none Prescriptions: none Over the Counter: none History of alcohol / drug use?: No history of alcohol / drug abuse  Sleep: Good  Appetite:  Good  Current Medications: Current Facility-Administered Medications  Medication Dose Route Frequency Provider Last Rate Last Dose  . acetaminophen (TYLENOL) tablet 650 mg  650 mg Oral Q6H PRN Patrecia Pour, NP      . alum & mag hydroxide-simeth (MAALOX/MYLANTA) 200-200-20 MG/5ML suspension 30 mL  30 mL Oral Q4H PRN Patrecia Pour, NP      . busPIRone (BUSPAR) tablet 10 mg  10 mg Oral TID Patrecia Pour, NP   10 mg at 03/19/16 1158  . clonazePAM (KLONOPIN) tablet 0.5 mg  0.5 mg Oral TID PRN Jenne Campus, MD      . hydrOXYzine (ATARAX/VISTARIL) tablet 25 mg  25 mg Oral Q6H PRN Patrecia Pour, NP      . magnesium hydroxide (MILK OF MAGNESIA) suspension 30 mL  30 mL Oral Daily PRN Patrecia Pour, NP      . ondansetron (ZOFRAN-ODT) disintegrating tablet 4 mg  4 mg Oral Q8H PRN Patrecia Pour, NP      . PARoxetine (PAXIL) tablet 20 mg  20 mg Oral Daily Myer Peer Chanique Duca, MD      . QUEtiapine (SEROQUEL) tablet 100 mg  100 mg Oral QHS Patrecia Pour, NP   100 mg at 03/18/16 2055  . [START ON 03/20/2016] QUEtiapine (SEROQUEL) tablet 25 mg  25 mg Oral q morning - 10a Jenne Campus, MD        Lab Results:  Results for orders placed or performed during the hospital encounter of 03/17/16 (from the past 48 hour(s))  Lipid panel     Status:  None   Collection Time: 03/18/16  6:31 PM  Result Value Ref Range   Cholesterol 172 0 - 200 mg/dL   Triglycerides 96 <150 mg/dL   HDL 54 >40 mg/dL   Total CHOL/HDL Ratio 3.2 RATIO   VLDL 19 0 - 40 mg/dL   LDL Cholesterol 99 0 - 99 mg/dL    Comment:        Total Cholesterol/HDL:CHD Risk Coronary Heart Disease Risk Table                     Men   Women  1/2 Average Risk   3.4   3.3  Average Risk       5.0   4.4  2 X Average Risk   9.6   7.1  3 X Average Risk  23.4   11.0        Use the calculated Patient Ratio above and the CHD Risk Table to determine the patient's CHD Risk.        ATP III CLASSIFICATION (LDL):  <100     mg/dL   Optimal  100-129  mg/dL   Near or Above                    Optimal  130-159  mg/dL   Borderline  160-189  mg/dL   High  >190     mg/dL   Very High Performed at [facility]   TSH     Status: None   Collection Time: 03/18/16  6:31 PM  Result Value Ref Range   TSH 0.462 0.350 - 4.500 uIU/mL    Comment: Performed at [facility]    Blood Alcohol level:  Lab Results  Component Value Date   [facility] <11 06/21/2012   ETH <11 05/31/2012    Physical Findings: AIMS:  , ,  ,  ,    CIWA:    COWS:     Musculoskeletal: Strength & Muscle Tone: within normal limits Gait & Station: normal Patient leans: N/A  Psychiatric Specialty Exam: Physical Exam  ROS no vomiting , no nausea   Blood pressure 103/76, pulse 120, temperature 98 F (36.7 C), temperature source Oral, resp. rate 16, height '5\' 6"'$  (1.676 m), weight 200 lb (90.719 kg), SpO2 100 %.Body mass index is 32.3 kg/(m^2).  General Appearance: Fairly Groomed  Eye Contact:  Good  Speech:  Normal Rate  Volume:  Normal  Mood:  Anxious and Depressed  Affect:  Constricted, but reactive   Thought Process:  Linear  Orientation:  Other:  fully alert and attentive   Thought Content:  denies hallucinations, no delusions expressed, not internally preoccupied  Suicidal Thoughts:  No  at this time denies suicidal plan or intention and contracts for safety on the unit   Homicidal Thoughts:  No  Memory:  recent and remote grossly intact   Judgement:  Fair  Insight:  Fair  Psychomotor Activity:  Normal  Concentration:  Concentration: Good and Attention Span: Good  Recall:  Good  Fund of Knowledge:  Good  Language:  Good  Akathisia:  Negative  Handed:  Right  AIMS (if indicated):     Assets:  Desire for Improvement Resilience  ADL's:  Fair   Cognition:  WNL  Sleep:  Number of Hours: 6.75   Assessment - patient is reporting partial improvement of mood, anxiety, and denies current active SI. She describes history of good response to Paxil in the past, and is hoping to restart this medication .  Treatment Plan Summary: Daily contact with patient to assess and evaluate symptoms and progress in treatment, Medication management, Plan inpatient treatment  and medications as below  Encourage group participation, milieu participation to work on symptom reduction  D/C Celexa  Start Paxil 20 mgrs QHS for depression and anxiety Continue Klonopin 0.5 mgrs TID PRN for anxiety as needed  Continue Seroquel at 25 mgrs QAM and 100 mgr QHS for mood disorder  Continue Buspar 10 mgrs TID for anxiety  Neita Garnet, MD 03/19/2016, 4:38 PM

## 2016-03-19 NOTE — Progress Notes (Signed)
DAR NOTE: Pt present with flat affect and depressed mood in the unit. Pt continue to stay in the room, pt stated " I don't like staying where there so many people." Pt complains of dizziness, Dr made aware. Pt refused to feel the self inventory forms. Pt denied any physical pain, provided with medications per providers orders. Pt's labs and vitals were monitored throughout the day. Pt supported emotionally and encouraged to express concerns and questions. Pt educated on medications. Pt's safety ensured with 15 minute and environmental checks. Pt currently denies SI/HI and A/V hallucinations. Pt verbally agrees to seek staff if SI/HI or A/VH occurs and to consult with staff before acting on any harmful thoughts. Will continue POC.
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
## 2016-03-19 NOTE — B[facility] Suicide Risk Assessment (Signed)
[facility] INPATIENT:  Family/Significant Other Suicide Prevention Education  Suicide Prevention Education:  Patient Refusal for Family/Significant Other Suicide Prevention Education: The patient Sydney LatheCarol Fitzmaurice has refused to provide written consent for family/significant other to be provided Family/Significant Other Suicide Prevention Education during admission and/or prior to discharge.  Physician notified.  Elaina Hoopsarter, Manreet Kiernan M 03/19/2016, 2:58 PM

## 2016-03-20 LAB — RAPID URINE DRUG SCREEN, HOSP PERFORMED
Amphetamines: NOT DETECTED
BARBITURATES: NOT DETECTED
BENZODIAZEPINES: NOT DETECTED
COCAINE: NOT DETECTED
Opiates: NOT DETECTED
Tetrahydrocannabinol: NOT DETECTED

## 2016-03-20 LAB — HEMOGLOBIN A1C
HEMOGLOBIN A1C: 5.6 % (ref 4.8–5.6)
MEAN PLASMA GLUCOSE: 114 mg/dL

## 2016-03-20 LAB — PROLACTIN: Prolactin: 6.5 ng/mL (ref 4.8–23.3)

## 2016-03-20 NOTE — Progress Notes (Signed)
D: Patient denies SI/HI and A/V hallucinations; patient complains of a lot of anxiety  A: Monitored q 15 minutes; patient encouraged to attend groups; patient educated about medications; patient given medications per physician orders; patient encouraged to express feelings and/or concerns  R: Patient is isolative and stays in her room sleep most of the day; patient does not attend meals because she does not like crowds she reports; patient sits in the dayroom minimally;  patient's interaction with staff and peers is approrpriate; patient was able to set goal to talk with staff 1:1 when having feelings of SI; patient is taking medications as prescribed and tolerating medications; patient has not attended any groups

## 2016-03-20 NOTE — Progress Notes (Signed)
Patient ID: Sydney Gonzales, female   DOB: 29-Mar-1956, 60 y.o.   MRN: [id number] [facility] MD Progress Note  03/20/2016 5:04 PM Sydney Gonzales  MRN:  [id number] Subjective:   Patient reports ongoing depression, sadness, passive thoughts of death, but denies any plan or intention of hurting self and contracts for safety on the unit. States she realizes that medications " take time to kick in", and reports that current regimen ( Paxil, Seroquel) has been effective and well tolerated ( had been off psychiatric medications for several weeks prior to admission )   Objective : I have reviewed case with treatment team and have met with patient . Presents depressed, anxious, endorsing some passive SI, but denying any plan or intention of suicide and identifying her family, grandchildren as protective factor against hurting self. Thus far tolerating medications well- denies side effects. Limited participation in milieu , tends to isolate , which she states is partially due to social anxiety .  No disruptive or agitated behaviors on the unit  Labs reviewed as below . Principal Problem: Bipolar affective disorder, depressed, severe ([place]) Diagnosis:   Patient Active Problem List   Diagnosis Date Noted  . Bipolar affective disorder, depressed, severe ([place]) [F31.4] 03/17/2016  . Anxiety [F41.9]    Total Time spent with patient: 20 minutes    Past Medical History:  Past Medical History  Diagnosis Date  . Chronic back pain   . Migraine   . Chronic abdominal pain   . Hepatitis C   . Abdominal adhesions   . Anxiety     Past Surgical History  Procedure Laterality Date  . Gallbladder surgery    . Abdominal hysterectomy    . Appendectomy    . Cholecystectomy     Family History: History reviewed. No pertinent family history.  Social History:  History  Alcohol Use No     History  Drug Use No    Social History   Social History  . Marital Status: Divorced    Spouse Name: N/A  . Number of Children: N/A  .  Years of Education: N/A   Social History Main Topics  . Smoking status: Current Every Day Smoker -- 0.50 packs/day  . Smokeless tobacco: Never Used  . Alcohol Use: No  . Drug Use: No  . Sexual Activity: Not Asked   Other Topics Concern  . None   Social History Narrative   ** Merged History Encounter **       Additional Social History:    Pain Medications: none Prescriptions: none Over the Counter: none History of alcohol / drug use?: No history of alcohol / drug abuse  Sleep: Good  Appetite:  Good  Current Medications: Current Facility-Administered Medications  Medication Dose Route Frequency Provider Last Rate Last Dose  . acetaminophen (TYLENOL) tablet 650 mg  650 mg Oral Q6H PRN Patrecia Pour, NP      . alum & mag hydroxide-simeth (MAALOX/MYLANTA) 200-200-20 MG/5ML suspension 30 mL  30 mL Oral Q4H PRN Patrecia Pour, NP   30 mL at 03/19/16 1004  . busPIRone (BUSPAR) tablet 10 mg  10 mg Oral TID Patrecia Pour, NP   10 mg at 03/20/16 0804  . clonazePAM (KLONOPIN) tablet 0.5 mg  0.5 mg Oral TID PRN Jenne Campus, MD   0.5 mg at 03/20/16 0804  . hydrOXYzine (ATARAX/VISTARIL) tablet 25 mg  25 mg Oral Q6H PRN Patrecia Pour, NP      . magnesium hydroxide (MILK OF  MAGNESIA) suspension 30 mL  30 mL Oral Daily PRN Patrecia Pour, NP      . ondansetron (ZOFRAN-ODT) disintegrating tablet 4 mg  4 mg Oral Q8H PRN Patrecia Pour, NP      . PARoxetine (PAXIL) tablet 20 mg  20 mg Oral Daily Jenne Campus, MD   20 mg at 03/20/16 0804  . QUEtiapine (SEROQUEL) tablet 100 mg  100 mg Oral QHS Patrecia Pour, NP   100 mg at 03/19/16 2122  . QUEtiapine (SEROQUEL) tablet 25 mg  25 mg Oral q morning - 10a Jenne Campus, MD   25 mg at 03/20/16 1048    Lab Results:  Results for orders placed or performed during the hospital encounter of 03/17/16 (from the past 48 hour(s))  Lipid panel     Status: None   Collection Time: 03/18/16  6:31 PM  Result Value Ref Range   Cholesterol 172 0 -  200 mg/dL   Triglycerides 96 <150 mg/dL   HDL 54 >40 mg/dL   Total CHOL/HDL Ratio 3.2 RATIO   VLDL 19 0 - 40 mg/dL   LDL Cholesterol 99 0 - 99 mg/dL    Comment:        Total Cholesterol/HDL:CHD Risk Coronary Heart Disease Risk Table                     Men   Women  1/2 Average Risk   3.4   3.3  Average Risk       5.0   4.4  2 X Average Risk   9.6   7.1  3 X Average Risk  23.4   11.0        Use the calculated Patient Ratio above and the CHD Risk Table to determine the patient's CHD Risk.        ATP III CLASSIFICATION (LDL):  <100     mg/dL   Optimal  100-129  mg/dL   Near or Above                    Optimal  130-159  mg/dL   Borderline  160-189  mg/dL   High  >190     mg/dL   Very High Performed at [facility]   Prolactin     Status: None   Collection Time: 03/18/16  6:31 PM  Result Value Ref Range   Prolactin 6.5 4.8 - 23.3 ng/mL    Comment: (NOTE) Performed At: [facility] [place], [place] [id number] Lindon Romp MD OE:[id number] Performed at [facility]   Hemoglobin A1c     Status: None   Collection Time: 03/18/16  6:31 PM  Result Value Ref Range   Hgb A1c MFr Bld 5.6 4.8 - 5.6 %    Comment: (NOTE)         Pre-diabetes: 5.7 - 6.4         Diabetes: >6.4         Glycemic control for adults with diabetes: <7.0    Mean Plasma Glucose 114 mg/dL    Comment: (NOTE) Performed At: [facility] [address] [place], [place] [id number] Lindon Romp MD CV:[id number] Performed at [facility]   TSH     Status: None   Collection Time: 03/18/16  6:31 PM  Result Value Ref Range   TSH 0.462 0.350 - 4.500 uIU/mL    Comment: Performed at [facility]  Urine rapid drug screen (hosp performed)     Status: None   Collection Time: 03/19/16 11:52 AM  Result Value Ref Range   Opiates NONE DETECTED NONE DETECTED   Cocaine NONE DETECTED NONE DETECTED   Benzodiazepines NONE  DETECTED NONE DETECTED   Amphetamines NONE DETECTED NONE DETECTED   Tetrahydrocannabinol NONE DETECTED NONE DETECTED   Barbiturates NONE DETECTED NONE DETECTED    Comment:        DRUG SCREEN FOR MEDICAL PURPOSES ONLY.  IF CONFIRMATION IS NEEDED FOR ANY PURPOSE, NOTIFY LAB WITHIN 5 DAYS.        LOWEST DETECTABLE LIMITS FOR URINE DRUG SCREEN Drug Class       Cutoff (ng/mL) Amphetamine      1000 Barbiturate      200 Benzodiazepine   841 Tricyclics       324 Opiates          300 Cocaine          300 THC              50 Performed at [facility]     Blood Alcohol level:  Lab Results  Component Value Date   [facility] <11 06/21/2012   ETH <11 05/31/2012    Physical Findings: AIMS:  , ,  ,  ,    CIWA:    COWS:     Musculoskeletal: Strength & Muscle Tone: within normal limits Gait & Station: normal Patient leans: N/A  Psychiatric Specialty Exam: Physical Exam  ROS no vomiting , no nausea   Blood pressure 108/73, pulse 84, temperature 99.1 F (37.3 C), temperature source Oral, resp. rate 18, height _0  (1.676 m), weight 200 lb (90.719 kg), SpO2 100 %.Body mass index is 32.3 kg/(m^2).  General Appearance: Fairly Groomed  Eye Contact:  Good  Speech:  Normal Rate  Volume:  Normal  Mood:  Remains depressed   Affect:  Constricted  Thought Process:  Linear  Orientation:  Other:  fully alert and attentive   Thought Content:  denies hallucinations, no delusions expressed, not internally preoccupied  Suicidal Thoughts:  No at this time denies suicidal plan or intention and contracts for safety on the unit   Homicidal Thoughts:  No  Memory:  recent and remote grossly intact   Judgement:  Fair  Insight:  Fair  Psychomotor Activity:  Normal  Concentration:  Concentration: Good and Attention Span: Good  Recall:  Good  Fund of Knowledge:  Good  Language:  Good  Akathisia:  Negative  Handed:  Right  AIMS (if indicated):     Assets:  Desire for  Improvement Resilience  ADL's:  Fair   Cognition:  WNL  Sleep:  Number of Hours: 6.75   Assessment - patient reports chronic depression and anxiety. At this time remains sad, depressed, anxious, and describes that social isolation/avoidance is related to chronic symptoms of social anxiety. She has history of good response to Seroquel and Paxil, which she had been off of for several weeks prior to admission - thus far tolerating well . Some passive thoughts of death, but denies SI and contracts for safety on the unit. No psychotic symptoms. Treatment Plan Summary: Daily contact with patient to assess and evaluate symptoms and progress in treatment, Medication management, Plan inpatient treatment  and medications as below  Encourage group participation, milieu participation to work on symptom reduction  Continue  Paxil 20 mgrs QHS for depression and anxiety Continue Klonopin 0.5 mgrs TID  PRN for anxiety as needed  Continue Seroquel at 25 mgrs QAM and 100 mgr QHS for mood disorder  Continue Buspar 10 mgrs TID for anxiety  Neita Garnet, MD 03/20/2016, 5:04 PM

## 2016-03-20 NOTE — [facility] Group Notes (Signed)
[facility] LCSW Group Therapy 03/20/2016 1:15 PM  Type of Therapy: Group Therapy- Feelings about Diagnosis  Pt did not attend, declined invitation.   Chad CordialLauren Carter, LCSWA 03/20/2016 3:47 PM

## 2016-03-20 NOTE — Progress Notes (Signed)
D: Pt has depressed affect and mood.  She forwards little information to Probation officer.  She reports her day was "all right."  Denies having a daily goal.  Pt reports the best part of her day was "sleep."  She reports she was out of her room more today.  Pt denies SI/HI, denies hallucinations, denies pain.  Pt has been visible in milieu at beginning of shift.   A: Introduced self to pt.  Met with pt and offered support and encouragement.  Medication administered per order.  R: Pt is compliant with medication.  Pt verbally contracts for safety.  Will continue to monitor and assess.

## 2016-03-20 NOTE — Progress Notes (Signed)
Recreation Therapy Notes  Animal-Assisted Activity (AAA) Program Checklist/Progress Notes Patient Eligibility Criteria Checklist & Daily Group note for Rec Tx Intervention  Date: 06.06.2017 Time: 2:45pm [address]: [address]    AAA/T Program Assumption of Risk Form signed by Patient/ or Parent Legal Guardian Yes  Patient is free of allergies or sever asthma Yes  Patient reports no fear of animals Yes  Patient reports no history of cruelty to animals Yes  Patient understands his/her participation is voluntary Yes  Behavioral Response: Did not attend.    Sydney Gonzales, LRT/CTRS        Chessie Neuharth L 03/20/2016 3:04 PM

## 2016-03-21 MED ORDER — HYDROXYZINE HCL 25 MG PO TABS: 25.0000 mg | ORAL_TABLET | Freq: Four times a day (QID) | ORAL | Status: DC | PRN

## 2016-03-21 MED ORDER — CLONAZEPAM 0.5 MG PO TABS: 0.5000 mg | ORAL_TABLET | Freq: Two times a day (BID) | ORAL | Status: DC | PRN

## 2016-03-21 MED ORDER — PAROXETINE HCL 20 MG PO TABS: 20.0000 mg | ORAL_TABLET | Freq: Every day | ORAL | Status: DC

## 2016-03-21 MED ORDER — QUETIAPINE FUMARATE 25 MG PO TABS: 25.0000 mg | ORAL_TABLET | Freq: Every morning | ORAL | Status: DC

## 2016-03-21 MED ORDER — QUETIAPINE FUMARATE 100 MG PO TABS: 100.0000 mg | ORAL_TABLET | Freq: Every day | ORAL | Status: DC

## 2016-03-21 MED ORDER — BUSPIRONE HCL 10 MG PO TABS: 10.0000 mg | ORAL_TABLET | Freq: Three times a day (TID) | ORAL | Status: DC

## 2016-03-21 MED FILL — clonazePAM 0.5 MG TABS: 0.5 | 5 days supply | Qty: 10 | Fill #0

## 2016-03-21 NOTE — Progress Notes (Signed)
Patient ID: Sydney Gonzales, female   DOB: [birth year], 60 y.o.   MRN: [id number]  Pt currently presents with a flat affect and depressed behavior. Per self inventory, pt rates depression at a 8. Pt reports increased anxiety around large groups of people but tolerates attending a meal in the cafeteria appropriately. Pt reports good sleep and a good appetite. Pt reports feeling better today than yesterday.   Pt provided with medications per providers orders. Pt's labs and vitals were monitored throughout the day. Pt supported emotionally and encouraged to express concerns and questions. Pt educated on medications and suicide prevention resources.   Pt's safety ensured with 15 minute and environmental checks. Pt currently denies SI/HI and A/V hallucinations. Pt verbally agrees to seek staff if SI/HI or A/VH occurs and to consult with staff before acting on any harmful thoughts. Pt to be discharged to an open bed at a women's shelter today. Will continue POC.
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
## 2016-03-21 NOTE — Progress Notes (Signed)
Discharge note: Pt received both written and verbal discharge instructions. Pt verbalized understanding of discharge instructions. Pt agreed to f/u appt and med regimen. Pt received prescriptions, sample meds, AVS, SRA, Transition record and bus pass. Pt received belongings from room and locker. Pt safely discharged to the lobby.

## 2016-03-21 NOTE — Progress Notes (Signed)
Recreation Therapy Notes  Date: 06.07.2017 Time: 9:30am Location: [address] Group Room   Group Topic: Stress Management  Goal Area(s) Addresses:  Patient will actively participate in stress management techniques presented during session.   Behavioral Response:Did not attend.   Marykay Lexenise L Elby Blackwelder, LRT/CTRS         Linnaea Ahn L 03/21/2016 11:54 AM

## 2016-03-21 NOTE — Plan of Care (Signed)
Problem: Medication: Goal: Compliance with prescribed medication regimen will improve Outcome: Progressing Pt has been compliant with medications tonight.    
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
## 2016-03-24 ENCOUNTER — Emergency Department (HOSPITAL_COMMUNITY): Payer: No Typology Code available for payment source

## 2016-03-24 ENCOUNTER — Emergency Department (HOSPITAL_COMMUNITY)
Admission: EM | Admit: 2016-03-24 | Discharge: 2016-03-24 | Disposition: A | Discharge status: Home / Self Care | Payer: Self-pay | Attending: Emergency Medicine | Admitting: Emergency Medicine

## 2016-03-24 ENCOUNTER — Encounter (HOSPITAL_COMMUNITY): Payer: Self-pay | Admitting: *Deleted

## 2016-03-24 ENCOUNTER — Emergency Department (HOSPITAL_COMMUNITY): Payer: Self-pay

## 2016-03-24 DIAGNOSIS — R1084 Generalized abdominal pain: Secondary | ICD-10-CM

## 2016-03-24 DIAGNOSIS — Z79899 Other long term (current) drug therapy: Secondary | ICD-10-CM | POA: Insufficient documentation

## 2016-03-24 DIAGNOSIS — A5903 Trichomonal cystitis and urethritis: Secondary | ICD-10-CM | POA: Insufficient documentation

## 2016-03-24 DIAGNOSIS — N390 Urinary tract infection, site not specified: Secondary | ICD-10-CM | POA: Insufficient documentation

## 2016-03-24 DIAGNOSIS — F17200 Nicotine dependence, unspecified, uncomplicated: Secondary | ICD-10-CM | POA: Insufficient documentation

## 2016-03-24 LAB — CBC
HCT: 38.4 % (ref 36.0–46.0)
HEMOGLOBIN: 12.1 g/dL (ref 12.0–15.0)
MCH: 28.9 pg (ref 26.0–34.0)
MCHC: 31.5 g/dL (ref 30.0–36.0)
MCV: 91.6 fL (ref 78.0–100.0)
Platelets: 152 10*3/uL (ref 150–400)
RBC: 4.19 MIL/uL (ref 3.87–5.11)
RDW: 15.2 % (ref 11.5–15.5)
WBC: 3.6 10*3/uL — AB (ref 4.0–10.5)

## 2016-03-24 LAB — COMPREHENSIVE METABOLIC PANEL
ALBUMIN: 3.2 g/dL — AB (ref 3.5–5.0)
ALK PHOS: 66 U/L (ref 38–126)
ALT: 42 U/L (ref 14–54)
ANION GAP: 6 (ref 5–15)
AST: 45 U/L — ABNORMAL HIGH (ref 15–41)
BILIRUBIN TOTAL: 0.2 mg/dL — AB (ref 0.3–1.2)
BUN: 8 mg/dL (ref 6–20)
CALCIUM: 9.1 mg/dL (ref 8.9–10.3)
CO2: 25 mmol/L (ref 22–32)
Chloride: 111 mmol/L (ref 101–111)
Creatinine, Ser: 0.67 mg/dL (ref 0.44–1.00)
GLUCOSE: 100 mg/dL — AB (ref 65–99)
Potassium: 3.8 mmol/L (ref 3.5–5.1)
Sodium: 142 mmol/L (ref 135–145)
TOTAL PROTEIN: 6.5 g/dL (ref 6.5–8.1)

## 2016-03-24 LAB — URINALYSIS, ROUTINE W REFLEX MICROSCOPIC
BILIRUBIN URINE: NEGATIVE
Glucose, UA: NEGATIVE mg/dL
Hgb urine dipstick: NEGATIVE
KETONES UR: NEGATIVE mg/dL
NITRITE: NEGATIVE
Protein, ur: NEGATIVE mg/dL
SPECIFIC GRAVITY, URINE: 1.018 (ref 1.005–1.030)
pH: 5.5 (ref 5.0–8.0)

## 2016-03-24 LAB — LIPASE, BLOOD: Lipase: 21 U/L (ref 11–51)

## 2016-03-24 LAB — URINE MICROSCOPIC-ADD ON: RBC / HPF: NONE SEEN RBC/hpf (ref 0–5)

## 2016-03-24 MED ORDER — OXYCODONE-ACETAMINOPHEN 5-325 MG PO TABS: 1.0000 | ORAL_TABLET | ORAL | Status: DC | PRN

## 2016-03-24 MED ORDER — CIPROFLOXACIN HCL 500 MG PO TABS: 500.0000 mg | ORAL_TABLET | Freq: Two times a day (BID) | ORAL | Status: DC

## 2016-03-24 MED ORDER — FENTANYL CITRATE (PF) 100 MCG/2ML IJ SOLN
50.0000 ug | Freq: Once | INTRAMUSCULAR | Status: AC
  Administered 2016-03-24: 50 ug via INTRAVENOUS
  Filled 2016-03-24: qty 2

## 2016-03-24 MED ORDER — ONDANSETRON HCL 4 MG PO TABS: 4.0000 mg | ORAL_TABLET | Freq: Three times a day (TID) | ORAL | Status: DC | PRN

## 2016-03-24 MED ORDER — DICYCLOMINE HCL 10 MG PO CAPS
10.0000 mg | ORAL_CAPSULE | Freq: Once | ORAL | Status: AC
  Administered 2016-03-24: 10 mg via ORAL
  Filled 2016-03-24: qty 1

## 2016-03-24 MED ORDER — GI COCKTAIL ~~LOC~~
30.0000 mL | Freq: Once | ORAL | Status: AC
  Administered 2016-03-24: 30 mL via ORAL
  Filled 2016-03-24: qty 30

## 2016-03-24 MED ORDER — METRONIDAZOLE 500 MG PO TABS: 500.0000 mg | ORAL_TABLET | Freq: Two times a day (BID) | ORAL | Status: DC

## 2016-03-24 MED ORDER — IOPAMIDOL (ISOVUE-300) INJECTION 61%
INTRAVENOUS | Status: AC
  Administered 2016-03-24: 100 mL
  Filled 2016-03-24: qty 100

## 2016-03-24 NOTE — ED Provider Notes (Signed)
CSN: [id number]     Arrival date & time 03/24/16  1456 History   First MD Initiated Contact with Patient 03/24/16 1609     Chief Complaint  Patient presents with  . Abdominal Pain     (Consider location/radiation/quality/duration/timing/severity/associated sxs/prior Treatment) HPI Patient presents with upper abdominal pain. Was seen 1 week ago for the same pain. States the pain is episodic. No radiation. She's had accompanying nausea without vomiting or diarrhea. Had a bowel movement this morning that she describes as white. She states she's been taking ibuprofen 800 mg every 4 hours for the pain. She also takes this for chronic migraines. No fever or chills. No urinary symptoms. Past Medical History  Diagnosis Date  . Chronic back pain   . Migraine   . Chronic abdominal pain   . Hepatitis C   . Abdominal adhesions   . Anxiety    Past Surgical History  Procedure Laterality Date  . Gallbladder surgery    . Abdominal hysterectomy    . Appendectomy    . Cholecystectomy     History reviewed. No pertinent family history. Social History  Substance Use Topics  . Smoking status: Current Every Day Smoker -- 0.50 packs/day  . Smokeless tobacco: Never Used  . Alcohol Use: No   OB History    No data available     Review of Systems  Constitutional: Negative for fever and chills.  Respiratory: Negative for cough and shortness of breath.   Gastrointestinal: Positive for nausea and abdominal pain. Negative for vomiting, diarrhea and constipation.  Genitourinary: Negative for dysuria, hematuria and flank pain.  Musculoskeletal: Negative for back pain.  Skin: Negative for rash and wound.  Neurological: Negative for dizziness, weakness, light-headedness, numbness and headaches.  All other systems reviewed and are negative.     Allergies  Shellfish allergy; Stadol; Talwin; Toradol; and Morphine and related  Home Medications   Prior to Admission medications   Medication Sig Start  Date End Date Taking? Authorizing Provider  busPIRone (BUSPAR) 10 MG tablet Take 1 tablet (10 mg total) by mouth 3 (three) times daily. 03/21/16  Yes Adonis Brook, NP  clonazePAM (KLONOPIN) 0.5 MG tablet Take 1 tablet (0.5 mg total) by mouth 2 (two) times daily as needed (anxeity). 03/21/16  Yes Adonis Brook, NP  PARoxetine (PAXIL) 20 MG tablet Take 1 tablet (20 mg total) by mouth daily. 03/21/16  Yes Adonis Brook, NP  QUEtiapine (SEROQUEL) 100 MG tablet Take 1 tablet (100 mg total) by mouth at bedtime. 03/21/16  Yes Adonis Brook, NP  QUEtiapine (SEROQUEL) 25 MG tablet Take 1 tablet (25 mg total) by mouth every morning. 03/21/16  Yes Adonis Brook, NP  ciprofloxacin (CIPRO) 500 MG tablet Take 1 tablet (500 mg total) by mouth 2 (two) times daily. One po bid x 7 days 03/24/16   Loren Racer, MD  hydrOXYzine (ATARAX/VISTARIL) 25 MG tablet Take 1 tablet (25 mg total) by mouth every 6 (six) hours as needed for anxiety. 03/21/16   Adonis Brook, NP  metroNIDAZOLE (FLAGYL) 500 MG tablet Take 1 tablet (500 mg total) by mouth 2 (two) times daily. One po bid x 7 days 03/24/16   Loren Racer, MD  ondansetron [facility]) 4 MG tablet Take 1 tablet (4 mg total) by mouth every 8 (eight) hours as needed for nausea or vomiting. 03/24/16   Loren Racer, MD  oxyCODONE-acetaminophen (PERCOCET/ROXICET) 5-325 MG tablet Take 1 tablet by mouth every 4 (four) hours as needed for severe pain. 03/24/16  Loren Raceravid Naureen Benton, MD   BP 137/78 mmHg  Pulse 77  Temp(Src) 99.1 F (37.3 C) (Oral)  Resp 18  Ht 5\' 6"  (1.676 m)  Wt 180 lb (81.647 kg)  BMI 29.07 kg/m2  SpO2 100% Physical Exam  Constitutional: She is oriented to person, place, and time. She appears well-developed and well-nourished. No distress.  HENT:  Head: Normocephalic and atraumatic.  Mouth/Throat: Oropharynx is clear and moist.  Eyes: EOM are normal. Pupils are equal, round, and reactive to light.  Neck: Normal range of motion. Neck supple.  Cardiovascular:  Normal rate and regular rhythm.   Pulmonary/Chest: Effort normal and breath sounds normal. No respiratory distress. She has no wheezes. She has no rales. She exhibits no tenderness.  Abdominal: Soft. Bowel sounds are normal. She exhibits no distension and no mass. Tenderness: Right upper quadrant and epigastric tenderness with palpation and mild diffuse abdominal tenderness. There is no rebound and no guarding.  Musculoskeletal: Normal range of motion. She exhibits no edema or tenderness.  No CVA tenderness bilaterally.  Neurological: She is alert and oriented to person, place, and time.  Results showed is without deficit. Sensation is intact.  Skin: Skin is warm and dry. No rash noted. No erythema.  Psychiatric: She has a normal mood and affect. Her behavior is normal.  Nursing note and vitals reviewed.   ED Course  Procedures (including critical care time) Labs Review Labs Reviewed  COMPREHENSIVE METABOLIC PANEL - Abnormal; Notable for the following:    Glucose, Bld 100 (*)    Albumin 3.2 (*)    AST 45 (*)    Total Bilirubin 0.2 (*)    All other components within normal limits  CBC - Abnormal; Notable for the following:    WBC 3.6 (*)    All other components within normal limits  URINALYSIS, ROUTINE W REFLEX MICROSCOPIC (NOT AT [facility]) - Abnormal; Notable for the following:    APPearance CLOUDY (*)    Leukocytes, UA LARGE (*)    All other components within normal limits  URINE MICROSCOPIC-ADD ON - Abnormal; Notable for the following:    Squamous Epithelial / LPF 0-5 (*)    Bacteria, UA RARE (*)    All other components within normal limits  LIPASE, BLOOD    Imaging Review Ct Abdomen Pelvis W Contrast  03/24/2016  CLINICAL DATA:  Acute onset of right-sided abdominal pain for 2 days. Initial encounter. EXAM: CT ABDOMEN AND PELVIS WITH CONTRAST TECHNIQUE: Multidetector CT imaging of the abdomen and pelvis was performed using the standard protocol following bolus administration of  intravenous contrast. CONTRAST:  100mL ISOVUE-300 IOPAMIDOL (ISOVUE-300) INJECTION 61% COMPARISON:  Abdominal radiograph performed earlier today at 5:33 p.m., and CT of the abdomen and pelvis from 03/16/2016 FINDINGS: Minimal bibasilar atelectasis is noted. A few calcified granulomata are noted at the left lung base. The liver and spleen are unremarkable in appearance. The patient is status post cholecystectomy, with clips noted at the gallbladder fossa. The pancreas and right adrenal gland are unremarkable. A 0.9 cm hypodensity is noted at the left adrenal gland. There is incomplete rotation of the right kidney. A few small left renal cysts are noted. The kidneys are otherwise unremarkable. There is no evidence of hydronephrosis. No renal or ureteral stones are seen. No perinephric stranding is appreciated. No free fluid is identified. The small bowel is unremarkable in appearance. The stomach is within normal limits. No acute vascular abnormalities are seen. Minimal calcification is seen along the abdominal aorta and its  branches. The patient is status post appendectomy. The colon is grossly unremarkable in appearance. The bladder is mildly distended and grossly unremarkable. The patient is status post hysterectomy. No suspicious adnexal masses are seen. No inguinal lymphadenopathy is seen. No acute osseous abnormalities are identified. IMPRESSION: 1. No acute abnormality seen to explain the patient's symptoms. 2. Few small left renal cysts noted. 3. 0.9 cm hypodensity at the left adrenal gland. Electronically Signed   By: Roanna Raider M.D.   On: 03/24/2016 21:34   Dg Abd Acute W/chest  03/24/2016  CLINICAL DATA:  Abdominal pain. EXAM: DG ABDOMEN ACUTE W/ 1V CHEST COMPARISON:  03/16/2016 CT abdomen/pelvis. 05/25/2012 chest radiograph. FINDINGS: Stable cardiomediastinal silhouette with top-normal heart size. No pneumothorax. No pleural effusion. Lungs appear clear, with no acute consolidative airspace disease  and no pulmonary edema. Cholecystectomy clips are seen in the right upper quadrant of the abdomen. There a few mildly dilated small bowel loops in the bilateral abdomen measuring up to 3.2 cm in the right abdomen, with a few scattered air-fluid levels. Minimal stool and gas in the colon. No evidence of pneumatosis or pneumoperitoneum. No pathologic soft tissue calcifications. IMPRESSION: 1. No active disease in the chest. 2. Mildly dilated small bowel loops with scattered air-fluid levels in the bilateral abdomen. Minimal stool and gas in the colon. Differential includes mild partial distal small bowel obstruction versus mild adynamic ileus. Correlate with CT abdomen/pelvis with oral and IV contrast as clinically warranted. Electronically Signed   By: Delbert Phenix M.D.   On: 03/24/2016 18:27   I have personally reviewed and evaluated these images and lab results as part of my medical decision-making.   EKG Interpretation None      MDM   Final diagnoses:  Generalized abdominal pain  Trichomonal urethritis  UTI (lower urinary tract infection)   Abdominal exam remains benign. CT abdomen is normal. Will treat for UTI and Trichomonas. Informed to have all sexual partners evaluated. Return precautions given.     Loren Racer, MD 03/24/16 2308

## 2016-03-24 NOTE — ED Notes (Addendum)
Pt arrived by [facility] for right side abd pain that started today, reports white stools and blood noted when having a bowel movement. Iv started pta.

## 2016-03-24 NOTE — Discharge Instructions (Signed)
Abdominal Pain, Adult Many things can cause abdominal pain. Usually, abdominal pain is not caused by a disease and will improve without treatment. It can often be observed and treated at home. Your health care provider will do a physical exam and possibly order blood tests and X-rays to help determine the seriousness of your pain. However, in many cases, more time must pass before a clear cause of the pain can be found. Before that point, your health care provider may not know if you need more testing or further treatment. HOME CARE INSTRUCTIONS Monitor your abdominal pain for any changes. The following actions may help to alleviate any discomfort you are experiencing:  Only take over-the-counter or prescription medicines as directed by your health care provider.  Do not take laxatives unless directed to do so by your health care provider.  Try a clear liquid diet (broth, tea, or water) as directed by your health care provider. Slowly move to a bland diet as tolerated. SEEK MEDICAL CARE IF:  You have unexplained abdominal pain.  You have abdominal pain associated with nausea or diarrhea.  You have pain when you urinate or have a bowel movement.  You experience abdominal pain that wakes you in the night.  You have abdominal pain that is worsened or improved by eating food.  You have abdominal pain that is worsened with eating fatty foods.  You have a fever. SEEK IMMEDIATE MEDICAL CARE IF:  Your pain does not go away within 2 hours.  You keep throwing up (vomiting).  Your pain is felt only in portions of the abdomen, such as the right side or the left lower portion of the abdomen.  You pass bloody or black tarry stools. MAKE SURE YOU:  Understand these instructions.  Will watch your condition.  Will get help right away if you are not doing well or get worse.   This information is not intended to replace advice given to you by your health care provider. Make sure you discuss  any questions you have with your health care provider.   Document Released: 07/11/2005 Document Revised: 06/22/2015 Document Reviewed: 06/10/2013 Elsevier Interactive Patient Education 2016 [employer].  Trichomoniasis Trichomoniasis is an infection caused by an organism called Trichomonas. The infection can affect both women and men. In women, the outer female genitalia and the vagina are affected. In men, the penis is mainly affected, but the prostate and other reproductive organs can also be involved. Trichomoniasis is a sexually transmitted infection (STI) and is most often passed to another person through sexual contact.  RISK FACTORS  Having unprotected sexual intercourse.  Having sexual intercourse with an infected partner. SIGNS AND SYMPTOMS  Symptoms of trichomoniasis in women include:  Abnormal gray-green frothy vaginal discharge.  Itching and irritation of the vagina.  Itching and irritation of the area outside the vagina. Symptoms of trichomoniasis in men include:   Penile discharge with or without pain.  Pain during urination. This results from inflammation of the urethra. DIAGNOSIS  Trichomoniasis may be found during a Pap test or physical exam. Your health care provider may use one of the following methods to help diagnose this infection:  Testing the pH of the vagina with a test tape.  Using a vaginal swab test that checks for the Trichomonas organism. A test is available that provides results within a few minutes.  Examining a urine sample.  Testing vaginal secretions. Your health care provider may test you for other STIs, including HIV. TREATMENT   You  may be given medicine to fight the infection. Women should inform their health care provider if they could be or are pregnant. Some medicines used to treat the infection should not be taken during pregnancy.  Your health care provider may recommend over-the-counter medicines or creams to decrease itching or  irritation.  Your sexual partner will need to be treated if infected.  Your health care provider may test you for infection again 3 months after treatment. HOME CARE INSTRUCTIONS   Take medicines only as directed by your health care provider.  Take over-the-counter medicine for itching or irritation as directed by your health care provider.  Do not have sexual intercourse while you have the infection.  Women should not douche or wear tampons while they have the infection.  Discuss your infection with your partner. Your partner may have gotten the infection from you, or you may have gotten it from your partner.  Have your sex partner get examined and treated if necessary.  Practice safe, informed, and protected sex.  See your health care provider for other STI testing. SEEK MEDICAL CARE IF:   You still have symptoms after you finish your medicine.  You develop abdominal pain.  You have pain when you urinate.  You have bleeding after sexual intercourse.  You develop a rash.  Your medicine makes you sick or makes you throw up (vomit). MAKE SURE YOU:  Understand these instructions.  Will watch your condition.  Will get help right away if you are not doing well or get worse.   This information is not intended to replace advice given to you by your health care provider. Make sure you discuss any questions you have with your health care provider.   Document Released: 03/27/2001 Document Revised: 10/22/2014 Document Reviewed: 07/13/2013 Elsevier Interactive Patient Education 2016 Elsevier Inc.  Urinary Tract Infection A urinary tract infection (UTI) can occur any place along the urinary tract. The tract includes the kidneys, ureters, bladder, and urethra. A type of germ called bacteria often causes a UTI. UTIs are often helped with antibiotic medicine.  HOME CARE   If given, take antibiotics as told by your doctor. Finish them even if you start to feel better.  Drink  enough fluids to keep your pee (urine) clear or pale yellow.  Avoid tea, drinks with caffeine, and bubbly (carbonated) drinks.  Pee often. Avoid holding your pee in for a long time.  Pee before and after having sex (intercourse).  Wipe from front to back after you poop (bowel movement) if you are a woman. Use each tissue only once. GET HELP RIGHT AWAY IF:   You have back pain.  You have lower belly (abdominal) pain.  You have chills.  You feel sick to your stomach (nauseous).  You throw up (vomit).  Your burning or discomfort with peeing does not go away.  You have a fever.  Your symptoms are not better in 3 days. MAKE SURE YOU:   Understand these instructions.  Will watch your condition.  Will get help right away if you are not doing well or get worse.   This information is not intended to replace advice given to you by your health care provider. Make sure you discuss any questions you have with your health care provider.   Document Released: 03/19/2008 Document Revised: 10/22/2014 Document Reviewed: 05/01/2012 Elsevier Interactive Patient Education [employer]2016 Elsevier Inc.

## 2016-03-29 ENCOUNTER — Emergency Department (HOSPITAL_COMMUNITY)
Admission: EM | Admit: 2016-03-29 | Discharge: 2016-03-30 | Disposition: A | Discharge status: Home / Self Care | Payer: No Typology Code available for payment source | Attending: Emergency Medicine | Admitting: Emergency Medicine

## 2016-03-29 DIAGNOSIS — F17200 Nicotine dependence, unspecified, uncomplicated: Secondary | ICD-10-CM | POA: Insufficient documentation

## 2016-03-29 DIAGNOSIS — N760 Acute vaginitis: Secondary | ICD-10-CM | POA: Insufficient documentation

## 2016-03-29 DIAGNOSIS — B9689 Other specified bacterial agents as the cause of diseases classified elsewhere: Secondary | ICD-10-CM

## 2016-03-29 DIAGNOSIS — Z79899 Other long term (current) drug therapy: Secondary | ICD-10-CM | POA: Insufficient documentation

## 2016-03-29 DIAGNOSIS — N898 Other specified noninflammatory disorders of vagina: Secondary | ICD-10-CM

## 2016-03-29 NOTE — ED Notes (Signed)
Pt arrives to the ER via EMS [facility] for complaints UTI and vaginal discharge; pt states that she was seen on Sunday and diagnosed with UTI but states that her symptoms are not any better; pt states that she has dark foul smelling urine and has purulent vaginal discharge

## 2016-03-30 ENCOUNTER — Encounter (HOSPITAL_COMMUNITY): Payer: Self-pay | Admitting: *Deleted

## 2016-03-30 LAB — HIV ANTIBODY (ROUTINE TESTING W REFLEX): HIV SCREEN 4TH GENERATION: NONREACTIVE

## 2016-03-30 LAB — URINE MICROSCOPIC-ADD ON: RBC / HPF: NONE SEEN RBC/hpf (ref 0–5)

## 2016-03-30 LAB — WET PREP, GENITAL
Sperm: NONE SEEN
Trich, Wet Prep: NONE SEEN
Yeast Wet Prep HPF POC: NONE SEEN

## 2016-03-30 LAB — URINALYSIS, ROUTINE W REFLEX MICROSCOPIC
BILIRUBIN URINE: NEGATIVE
GLUCOSE, UA: NEGATIVE mg/dL
HGB URINE DIPSTICK: NEGATIVE
KETONES UR: NEGATIVE mg/dL
NITRITE: NEGATIVE
PH: 5 (ref 5.0–8.0)
Protein, ur: NEGATIVE mg/dL
SPECIFIC GRAVITY, URINE: 1.023 (ref 1.005–1.030)

## 2016-03-30 LAB — GC/CHLAMYDIA PROBE AMP (~~LOC~~) NOT AT ARMC
Chlamydia: NEGATIVE
NEISSERIA GONORRHEA: NEGATIVE

## 2016-03-30 LAB — RPR: RPR: NONREACTIVE

## 2016-03-30 MED ORDER — ACETAMINOPHEN 325 MG PO TABS
650.0000 mg | ORAL_TABLET | Freq: Once | ORAL | Status: AC
  Administered 2016-03-30: 650 mg via ORAL
  Filled 2016-03-30: qty 2

## 2016-03-30 MED ORDER — ONDANSETRON 4 MG PO TBDP: 4.0000 mg | ORAL_TABLET | Freq: Three times a day (TID) | ORAL | Status: DC | PRN

## 2016-03-30 MED ORDER — OXYCODONE-ACETAMINOPHEN 5-325 MG PO TABS: 1.0000 | ORAL_TABLET | ORAL | Status: DC | PRN

## 2016-03-30 MED ORDER — ONDANSETRON 4 MG PO TBDP
4.0000 mg | ORAL_TABLET | Freq: Once | ORAL | Status: AC
  Administered 2016-03-30: 4 mg via ORAL
  Filled 2016-03-30: qty 1

## 2016-03-30 MED ORDER — OXYCODONE-ACETAMINOPHEN 5-325 MG PO TABS
1.0000 | ORAL_TABLET | Freq: Once | ORAL | Status: AC
  Administered 2016-03-30: 1 via ORAL
  Filled 2016-03-30: qty 1

## 2016-03-30 NOTE — ED Provider Notes (Signed)
CSN: [id number]     Arrival date & time 03/29/16  2349 History   First MD Initiated Contact with Patient 03/30/16 [phone]     Chief Complaint  Patient presents with  . Urinary Tract Infection  . Vaginal Discharge     (Consider location/radiation/quality/duration/timing/severity/associated sxs/prior Treatment) HPI   Sydney Gonzales is a 60 y.o. female, with a history of Hepatitis C and abdominal adhesions, presenting to the ED with right flank pain for the past 5 days. Pt was seen on 6/10 for similar pain and was diagnosed with a UTI. Has only taken two days of her ABX for the UTI (Cipro) and Trich (Flagyl). Endorses 2 episodes of emesis and 2 episodes of diarrhea over the past 24 hours. Pt states her urine "smells like pus." Pt states she may have some abnormal vaginal discharge, but she is not sure. Last sexual contact was 2 months ago. Pt denies fever/chills, pelvic pain, hematochezia/melena, or any other complaints.      Past Medical History  Diagnosis Date  . Chronic back pain   . Migraine   . Chronic abdominal pain   . Hepatitis C   . Abdominal adhesions   . Anxiety    Past Surgical History  Procedure Laterality Date  . Gallbladder surgery    . Abdominal hysterectomy    . Appendectomy    . Cholecystectomy     No family history on file. Social History  Substance Use Topics  . Smoking status: Current Every Day Smoker -- 0.50 packs/day  . Smokeless tobacco: Never Used  . Alcohol Use: No   OB History    No data available     Review of Systems  Constitutional: Negative for fever and chills.  Gastrointestinal: Positive for nausea, vomiting and diarrhea. Negative for abdominal pain and blood in stool.  Genitourinary: Positive for flank pain and vaginal discharge. Negative for dysuria, vaginal bleeding and difficulty urinating.  Musculoskeletal: Negative for back pain.  All other systems reviewed and are negative.     Allergies  Shellfish allergy; Stadol; Talwin; Toradol;  and Morphine and related  Home Medications   Prior to Admission medications   Medication Sig Start Date End Date Taking? Authorizing Provider  busPIRone (BUSPAR) 10 MG tablet Take 1 tablet (10 mg total) by mouth 3 (three) times daily. 03/21/16  Yes Adonis BrookSheila Agustin, NP  ciprofloxacin (CIPRO) 500 MG tablet Take 1 tablet (500 mg total) by mouth 2 (two) times daily. One po bid x 7 days 03/24/16  Yes Loren Raceravid Yelverton, MD  clonazePAM (KLONOPIN) 0.5 MG tablet Take 1 tablet (0.5 mg total) by mouth 2 (two) times daily as needed (anxeity). 03/21/16  Yes Adonis BrookSheila Agustin, NP  metroNIDAZOLE (FLAGYL) 500 MG tablet Take 1 tablet (500 mg total) by mouth 2 (two) times daily. One po bid x 7 days 03/24/16  Yes Loren Raceravid Yelverton, MD  ondansetron [facility](ZOFRAN) 4 MG tablet Take 1 tablet (4 mg total) by mouth every 8 (eight) hours as needed for nausea or vomiting. 03/24/16  Yes Loren Raceravid Yelverton, MD  PARoxetine (PAXIL) 20 MG tablet Take 1 tablet (20 mg total) by mouth daily. 03/21/16  Yes Adonis BrookSheila Agustin, NP  QUEtiapine (SEROQUEL) 100 MG tablet Take 1 tablet (100 mg total) by mouth at bedtime. 03/21/16  Yes Adonis BrookSheila Agustin, NP  QUEtiapine (SEROQUEL) 25 MG tablet Take 1 tablet (25 mg total) by mouth every morning. 03/21/16  Yes Adonis BrookSheila Agustin, NP  ondansetron (ZOFRAN ODT) 4 MG disintegrating tablet Take 1 tablet (4 mg total) by  mouth every 8 (eight) hours as needed for nausea or vomiting. 03/30/16   Shawn C Joy, PA-C  oxyCODONE-acetaminophen (PERCOCET/ROXICET) 5-325 MG tablet Take 1 tablet by mouth every 4 (four) hours as needed for severe pain. 03/30/16   Shawn C Joy, PA-C   BP 124/82 mmHg  Pulse 87  Temp(Src) 98.8 F (37.1 C) (Oral)  Resp 16  SpO2 98% Physical Exam  Constitutional: She appears well-developed and well-nourished. No distress.  HENT:  Head: Normocephalic and atraumatic.  Eyes: Conjunctivae are normal.  Neck: Neck supple.  Cardiovascular: Normal rate, regular rhythm, normal heart sounds and intact distal pulses.    Pulmonary/Chest: Effort normal and breath sounds normal. No respiratory distress.  Abdominal: Soft. There is no tenderness. There is no guarding.  Genitourinary:  External genitalia normal Vagina with discharge - scant white/yellow discharge Cervix  normal negative for cervical motion tenderness Adnexa palpated, no masses or negative for tenderness noted Bladder palpated negative for tenderness Uterus palpated no masses or negative for tenderness Otherwise normal female genitalia. Med Tech, Sheria Lang, served as Biomedical engineer during exam.  Musculoskeletal: She exhibits no edema or tenderness.  Lymphadenopathy:    She has no cervical adenopathy.  Neurological: She is alert.  Skin: Skin is warm and dry. She is not diaphoretic.  Psychiatric: She has a normal mood and affect. Her behavior is normal.  Nursing note and vitals reviewed.   ED Course  Pelvic exam Date/Time: 03/30/2016 4:37 AM Performed by: Anselm Pancoast Authorized by: Anselm Pancoast Consent: Verbal consent obtained. Risks and benefits: risks, benefits and alternatives were discussed Consent given by: patient Patient understanding: patient states understanding of the procedure being performed Patient consent: the patient's understanding of the procedure matches consent given Procedure consent: procedure consent matches procedure scheduled Patient identity confirmed: verbally with patient and arm band Local anesthesia used: no Patient sedated: no Patient tolerance: Patient tolerated the procedure well with no immediate complications   (including critical care time) Labs Review Labs Reviewed  WET PREP, GENITAL - Abnormal; Notable for the following:    Clue Cells Wet Prep HPF POC PRESENT (*)    WBC, Wet Prep HPF POC MANY (*)    All other components within normal limits  URINALYSIS, ROUTINE W REFLEX MICROSCOPIC (NOT AT [facility]) - Abnormal; Notable for the following:    Color, Urine AMBER (*)    APPearance CLOUDY (*)     Leukocytes, UA MODERATE (*)    All other components within normal limits  URINE MICROSCOPIC-ADD ON - Abnormal; Notable for the following:    Squamous Epithelial / LPF 0-5 (*)    Bacteria, UA FEW (*)    Crystals CA OXALATE CRYSTALS (*)    All other components within normal limits  URINE CULTURE  HIV ANTIBODY (ROUTINE TESTING)  RPR  GC/CHLAMYDIA PROBE AMP ([place]) NOT AT [facility]    Imaging Review No results found. I have personally reviewed and evaluated these lab results as part of my medical decision-making.   EKG Interpretation None      MDM   Final diagnoses:  Vaginal discharge  BV (bacterial vaginosis)    Sydney Lathe presents with right flank pain accompanied by nausea, vomiting, and questionable vaginal discharge for the past 5 days.  Suspect patient may not have been taking her antibiotics for a long enough period of time. No indication for repeat imaging or blood labs at this time. Evidence of bacterial vaginosis wet prep, which would explain the patient's vaginal discharge. Patient has had  a cholecystectomy, appendectomy, and hysterectomy. Even if the patient has a pyelonephritis, she is taking the appropriate antibiotics. Patient encouraged to continue taking the antibiotics as prescribed. Follow-up with OB/GYN should symptoms continue. Return precautions discussed. Patient was understanding of these instructions and is comfortable with discharge.  Filed Vitals:   03/29/16 2351 03/30/16 0438  BP: 124/82 137/79  Pulse: 87 70  Temp: 98.8 F (37.1 C) 98.7 F (37.1 C)  TempSrc: Oral Oral  Resp: 16 16  SpO2: 98% 95%       Anselm Pancoast, PA-C 03/30/16 0520  Gilda Crease, MD 03/30/16 [phone]

## 2016-03-30 NOTE — Discharge Instructions (Signed)
You have been seen today for vaginal discharge. Your wet prep showed evidence of bacterial vaginosis. Continue taking the previously prescribed antibiotics in their entirety. [facility] should symptoms [facility]. Follow up with PCP as needed. Return to ED should symptoms worsen.

## 2016-03-30 NOTE — ED Notes (Signed)
Pt instructed that a urine specimen is needed

## 2016-03-31 LAB — URINE CULTURE: Culture: NO GROWTH

## 2016-04-22 ENCOUNTER — Emergency Department (HOSPITAL_COMMUNITY)
Admission: EM | Admit: 2016-04-22 | Discharge: 2016-04-23 | Disposition: A | Discharge status: Home / Self Care | Payer: Self-pay | Attending: Emergency Medicine | Admitting: Emergency Medicine

## 2016-04-22 ENCOUNTER — Encounter (HOSPITAL_COMMUNITY): Payer: Self-pay | Admitting: *Deleted

## 2016-04-22 ENCOUNTER — Emergency Department (HOSPITAL_COMMUNITY): Payer: Self-pay

## 2016-04-22 DIAGNOSIS — F319 Bipolar disorder, unspecified: Secondary | ICD-10-CM | POA: Insufficient documentation

## 2016-04-22 DIAGNOSIS — R0602 Shortness of breath: Secondary | ICD-10-CM | POA: Insufficient documentation

## 2016-04-22 DIAGNOSIS — F314 Bipolar disorder, current episode depressed, severe, without psychotic features: Secondary | ICD-10-CM | POA: Diagnosis present

## 2016-04-22 DIAGNOSIS — F17200 Nicotine dependence, unspecified, uncomplicated: Secondary | ICD-10-CM | POA: Insufficient documentation

## 2016-04-22 DIAGNOSIS — R600 Localized edema: Secondary | ICD-10-CM | POA: Insufficient documentation

## 2016-04-22 DIAGNOSIS — R45851 Suicidal ideations: Secondary | ICD-10-CM | POA: Insufficient documentation

## 2016-04-22 DIAGNOSIS — F1910 Other psychoactive substance abuse, uncomplicated: Secondary | ICD-10-CM | POA: Insufficient documentation

## 2016-04-22 DIAGNOSIS — Z79899 Other long term (current) drug therapy: Secondary | ICD-10-CM | POA: Insufficient documentation

## 2016-04-22 LAB — CBC WITH DIFFERENTIAL/PLATELET
BASOS ABS: 0 10*3/uL (ref 0.0–0.1)
Basophils Relative: 1 %
Eosinophils Absolute: 0.2 10*3/uL (ref 0.0–0.7)
Eosinophils Relative: 4 %
HEMATOCRIT: 33.3 % — AB (ref 36.0–46.0)
Hemoglobin: 11.1 g/dL — ABNORMAL LOW (ref 12.0–15.0)
LYMPHS ABS: 1.6 10*3/uL (ref 0.7–4.0)
LYMPHS PCT: 34 %
MCH: 30.3 pg (ref 26.0–34.0)
MCHC: 33.3 g/dL (ref 30.0–36.0)
MCV: 91 fL (ref 78.0–100.0)
MONO ABS: 1 10*3/uL (ref 0.1–1.0)
MONOS PCT: 21 %
NEUTROS ABS: 1.9 10*3/uL (ref 1.7–7.7)
Neutrophils Relative %: 40 %
Platelets: 192 10*3/uL (ref 150–400)
RBC: 3.66 MIL/uL — ABNORMAL LOW (ref 3.87–5.11)
RDW: 14.7 % (ref 11.5–15.5)
WBC: 4.6 10*3/uL (ref 4.0–10.5)

## 2016-04-22 LAB — TROPONIN I

## 2016-04-22 LAB — COMPREHENSIVE METABOLIC PANEL
ALBUMIN: 3.7 g/dL (ref 3.5–5.0)
ALK PHOS: 63 U/L (ref 38–126)
ALT: 27 U/L (ref 14–54)
AST: 51 U/L — AB (ref 15–41)
Anion gap: 7 (ref 5–15)
BILIRUBIN TOTAL: 0.7 mg/dL (ref 0.3–1.2)
BUN: 11 mg/dL (ref 6–20)
CALCIUM: 8.6 mg/dL — AB (ref 8.9–10.3)
CO2: 30 mmol/L (ref 22–32)
CREATININE: 0.75 mg/dL (ref 0.44–1.00)
Chloride: 99 mmol/L — ABNORMAL LOW (ref 101–111)
GFR calc Af Amer: >60 mL/min (ref >60)
GFR calc non Af Amer: >60 mL/min (ref >60)
GLUCOSE: 81 mg/dL (ref 65–99)
Potassium: 3.1 mmol/L — ABNORMAL LOW (ref 3.5–5.1)
Sodium: 136 mmol/L (ref 135–145)
TOTAL PROTEIN: 7.4 g/dL (ref 6.5–8.1)

## 2016-04-22 LAB — ETHANOL: Alcohol, Ethyl (B): <5 mg/dL (ref <5)

## 2016-04-22 LAB — LIPASE, BLOOD: Lipase: 20 U/L (ref 11–51)

## 2016-04-22 LAB — BRAIN NATRIURETIC PEPTIDE: B Natriuretic Peptide: 28.7 pg/mL (ref 0.0–100.0)

## 2016-04-22 MED ORDER — PAROXETINE HCL 20 MG PO TABS
20.0000 mg | ORAL_TABLET | Freq: Every day | ORAL | Status: DC
  Administered 2016-04-23: 20 mg via ORAL
  Filled 2016-04-22: qty 1

## 2016-04-22 MED ORDER — BUSPIRONE HCL 10 MG PO TABS
10.0000 mg | ORAL_TABLET | Freq: Three times a day (TID) | ORAL | Status: DC
  Administered 2016-04-22 – 2016-04-23 (×3): 10 mg via ORAL
  Filled 2016-04-22 (×3): qty 1

## 2016-04-22 MED ORDER — ACETAMINOPHEN 500 MG PO TABS
1000.0000 mg | ORAL_TABLET | Freq: Four times a day (QID) | ORAL | Status: DC | PRN
  Administered 2016-04-22 – 2016-04-23 (×2): 1000 mg via ORAL
  Filled 2016-04-22 (×2): qty 2

## 2016-04-22 MED ORDER — QUETIAPINE FUMARATE 100 MG PO TABS
100.0000 mg | ORAL_TABLET | Freq: Every day | ORAL | Status: DC
  Administered 2016-04-22: 100 mg via ORAL
  Filled 2016-04-22: qty 1

## 2016-04-22 MED ORDER — CLONAZEPAM 1 MG PO TABS: 1.0000 mg | ORAL_TABLET | Freq: Two times a day (BID) | ORAL | Status: DC | PRN

## 2016-04-22 MED ORDER — ONDANSETRON 4 MG PO TBDP
4.0000 mg | ORAL_TABLET | Freq: Three times a day (TID) | ORAL | Status: DC | PRN
Start: 2016-04-22 — End: 2016-04-23

## 2016-04-22 MED ORDER — QUETIAPINE FUMARATE 25 MG PO TABS
25.0000 mg | ORAL_TABLET | Freq: Every morning | ORAL | Status: DC
  Administered 2016-04-23: 25 mg via ORAL
  Filled 2016-04-22: qty 1
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
## 2016-04-22 NOTE — Progress Notes (Signed)
Disposition CSW completed patient referrals to the following inpatient psych facilities:  [facility]  CSW will continue to follow patient for placement needs.  Seward SpeckLeo Adams Hinch [facility] CenterCSW,LCAS Behavioral Health Disposition CSW [phone]

## 2016-04-22 NOTE — ED Notes (Signed)
Attempted blood draw in left AC. Was unsuccessful.

## 2016-04-22 NOTE — ED Notes (Signed)
Patient noted in room. No complaints, stable, in no acute distress. Q15 minute rounds and monitoring via security cameras continue for safety. 

## 2016-04-22 NOTE — ED Notes (Signed)
Attempted to void with no success.

## 2016-04-22 NOTE — ED Notes (Signed)
Pt presents with bilateral leg swelling in feet and goes up to pt's knees.  Pt reports edema has been present x 1 week. Pt reports on and off SOB and states it happens about "once a day."  Pt ambulatory in triage.

## 2016-04-22 NOTE — ED Notes (Addendum)
[facility]. @ patient belongings bags and 1 bookbag [facility]
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
## 2016-04-22 NOTE — ED Notes (Signed)
Foot of bed raised due to + 3 pitting edema to bilateral lower extremities.

## 2016-04-22 NOTE — ED Notes (Signed)
Bed: WA09 Expected date:  Expected time:  Means of arrival:  Comments: Triage 1 

## 2016-04-23 ENCOUNTER — Emergency Department (HOSPITAL_COMMUNITY)
Admission: EM | Admit: 2016-04-23 | Discharge: 2016-04-23 | Disposition: A | Discharge status: Home / Self Care | Payer: No Typology Code available for payment source | Attending: Emergency Medicine | Admitting: Emergency Medicine

## 2016-04-23 ENCOUNTER — Encounter (HOSPITAL_COMMUNITY): Payer: Self-pay | Admitting: Emergency Medicine

## 2016-04-23 DIAGNOSIS — F17200 Nicotine dependence, unspecified, uncomplicated: Secondary | ICD-10-CM | POA: Insufficient documentation

## 2016-04-23 DIAGNOSIS — M7989 Other specified soft tissue disorders: Secondary | ICD-10-CM | POA: Insufficient documentation

## 2016-04-23 DIAGNOSIS — Z79899 Other long term (current) drug therapy: Secondary | ICD-10-CM | POA: Insufficient documentation

## 2016-04-23 DIAGNOSIS — F314 Bipolar disorder, current episode depressed, severe, without psychotic features: Secondary | ICD-10-CM | POA: Insufficient documentation

## 2016-04-23 LAB — URINALYSIS, ROUTINE W REFLEX MICROSCOPIC
Bilirubin Urine: NEGATIVE
GLUCOSE, UA: NEGATIVE mg/dL
HGB URINE DIPSTICK: NEGATIVE
Ketones, ur: NEGATIVE mg/dL
LEUKOCYTES UA: NEGATIVE
Nitrite: NEGATIVE
PH: 6 (ref 5.0–8.0)
PROTEIN: NEGATIVE mg/dL
SPECIFIC GRAVITY, URINE: 1.009 (ref 1.005–1.030)

## 2016-04-23 MED ORDER — QUETIAPINE FUMARATE 100 MG PO TABS: 200.0000 mg | ORAL_TABLET | Freq: Every day | ORAL | Status: DC
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
## 2016-04-23 NOTE — ED Provider Notes (Signed)
Emergency Department Provider Note  Time seen: Approximately 10:50 PM  I have reviewed the triage vital signs and the nursing notes.   HISTORY  Chief Complaint Foot Swelling   HPI Sydney Gonzales is a 60 y.o. female with past medical history of bipolar disorder and anxiety presents to the emergency department for reevaluation of her bilateral lower extremity edema. She was seen in the emergency department yesterday for similar complaint with associated suicidal thoughts. She had full laboratory evaluation yesterday and was transferred to the psychiatry service. Of service discharged her this morning. Patient states she is not having active suicidal or homicidal thoughts. She is feeling encouraged because she is seeing her counselor in the morning. She came back to the emergency department because of her lower extremity swelling. Denies swelling to this degree in the past. No associated chest pain or difficulty breathing. No abdominal pain. She has not tried wrapping the lower extremities or elevating them.Denies rash or drainage.    Past Medical History  Diagnosis Date  . Chronic back pain   . Migraine   . Chronic abdominal pain   . Hepatitis C   . Abdominal adhesions   . Anxiety     Patient Active Problem List   Diagnosis Date Noted  . Bipolar affective disorder, depressed, severe (HCC) 03/17/2016  . Anxiety     Past Surgical History  Procedure Laterality Date  . Gallbladder surgery    . Abdominal hysterectomy    . Appendectomy    . Cholecystectomy      Current Outpatient Rx  Name  Route  Sig  Dispense  Refill  . busPIRone (BUSPAR) 10 MG tablet   Oral   Take 1 tablet (10 mg total) by mouth 3 (three) times daily.   90 tablet   0   . PARoxetine (PAXIL) 20 MG tablet   Oral   Take 1 tablet (20 mg total) by mouth daily.   30 tablet   0   . QUEtiapine (SEROQUEL) 100 MG tablet   Oral   Take 1 tablet (100 mg total) by mouth at bedtime.   30 tablet   0   .  QUEtiapine (SEROQUEL) 25 MG tablet   Oral   Take 1 tablet (25 mg total) by mouth every morning.   30 tablet   0   . ciprofloxacin (CIPRO) 500 MG tablet   Oral   Take 1 tablet (500 mg total) by mouth 2 (two) times daily. One po bid x 7 days Patient not taking: Reported on 04/22/2016   14 tablet   0   . clonazePAM (KLONOPIN) 0.5 MG tablet   Oral   Take 1 tablet (0.5 mg total) by mouth 2 (two) times daily as needed (anxeity). Patient taking differently: Take 1 mg by mouth 2 (two) times daily as needed (anxeity).    10 tablet   0   . metroNIDAZOLE (FLAGYL) 500 MG tablet   Oral   Take 1 tablet (500 mg total) by mouth 2 (two) times daily. One po bid x 7 days Patient not taking: Reported on 04/22/2016   14 tablet   0   . ondansetron (ZOFRAN ODT) 4 MG disintegrating tablet   Oral   Take 1 tablet (4 mg total) by mouth every 8 (eight) hours as needed for nausea or vomiting. Patient not taking: Reported on 04/22/2016   20 tablet   0   . ondansetron (ZOFRAN) 4 MG tablet   Oral   Take 1 tablet (  4 mg total) by mouth every 8 (eight) hours as needed for nausea or vomiting. Patient not taking: Reported on 04/23/2016   12 tablet   0   . oxyCODONE-acetaminophen (PERCOCET/ROXICET) 5-325 MG tablet   Oral   Take 1 tablet by mouth every 4 (four) hours as needed for severe pain. Patient not taking: Reported on 04/22/2016   10 tablet   0     Allergies Shellfish allergy; Stadol; Talwin; Toradol; Nalbuphine; Ivp dye; and Morphine and related  History reviewed. No pertinent family history.  Social History Social History  Substance Use Topics  . Smoking status: Current Every Day Smoker -- 0.50 packs/day  . Smokeless tobacco: Never Used  . Alcohol Use: No    Review of Systems  Constitutional: No fever/chills Eyes: No visual changes. ENT: No sore throat. Cardiovascular: Denies chest pain. Respiratory: Denies shortness of breath. Gastrointestinal: No abdominal pain.  No nausea, no  vomiting.  No diarrhea.  No constipation. Genitourinary: Negative for dysuria. Musculoskeletal: Negative for back pain.  Skin: Negative for rash. Positive LE edema and pain.  Neurological: Negative for headaches, focal weakness or numbness.  10-point ROS otherwise negative.  ____________________________________________   PHYSICAL EXAM:  VITAL SIGNS: ED Triage Vitals  Enc Vitals Group     BP 04/23/16 1904 112/88 mmHg     Pulse Rate 04/23/16 1904 90     Resp 04/23/16 1904 16     Temp 04/23/16 1904 99.1 F (37.3 C)     Temp Source 04/23/16 1904 Oral     SpO2 04/23/16 1904 98 %     Pain Score 04/23/16 1903 8    Constitutional: Alert and oriented. Well appearing and in no acute distress. Eyes: Conjunctivae are normal. PERRL.  Head: Atraumatic. Nose: No congestion/rhinnorhea. Mouth/Throat: Mucous membranes are moist.  Oropharynx non-erythematous. Neck: No stridor.   Cardiovascular: Normal rate, regular rhythm. Good peripheral circulation. Grossly normal heart sounds.   Respiratory: Normal respiratory effort.  No retractions. Lungs CTAB. Gastrointestinal: Soft and nontender. No distention.  Musculoskeletal: Bilateral pitting (3+) LE edema and tenderness to palpation. No rash, warmth, or drainage. No gross deformities of extremities. Neurologic:  Normal speech and language. No gross focal neurologic deficits are appreciated.  Skin:  Skin is warm, dry and intact. No rash noted. Psychiatric: Mood and affect are normal. Speech and behavior are normal. Denies SI/HI.   ____________________________________________   LABS (all labs ordered are listed, but only abnormal results are displayed)  Labs reviewed from yesterday including CMP, CBC, BNP, and Troponin.   ____________________________________________  RADIOLOGY  None ____________________________________________   PROCEDURES  Procedure(s) performed:    Procedures  None ____________________________________________   INITIAL IMPRESSION / ASSESSMENT AND PLAN / ED COURSE  Pertinent labs & imaging results that were available during my care of the patient were reviewed by me and considered in my medical decision making (see chart for details).  Patient presents to the emergency department for evaluation of bilateral lower extremity edema. The patient has bilateral pitting edema of the lower extremities. No overlying cellulitis. Patient has no signs or symptoms to suggest systemic volume overload or new onset heart failure. She had a full set of labs which I reviewed her psychiatry admission yesterday. She has not been wrapping the fever keeping them elevated. I discussed care for her lower extremity edema at home in detail with her at the bedside. Plan to apply compression ACE bandage is in discharge with resource list for PCP follow up. Patient reports her mood is  improved. She sees a Veterinary surgeon in the morning regarding her psychiatry admission from yesterday. She denies any active suicidal or homicidal ideations to me. I believe that she is safe for discharge at this time.    ____________________________________________  FINAL CLINICAL IMPRESSION(S) / ED DIAGNOSES  Final diagnoses:  Foot swelling  Bipolar affective disorder, depressed, severe (HCC)     MEDICATIONS GIVEN DURING THIS VISIT:  None  NEW OUTPATIENT MEDICATIONS STARTED DURING THIS VISIT:  None   Note:  This document was prepared using Dragon voice recognition software and may include unintentional dictation errors.  Alona Bene, MD Emergency Medicine  Maia Plan, MD 04/23/16 [phone]

## 2016-04-23 NOTE — BH Assessment (Signed)
[facility] Assessment Progress Note  Per Thedore MinsMojeed Akintayo, MD, this pt does not require psychiatric hospitalization at this time.  [facility]  She is also to be provided with shelter information.  Discharge instructions include [facility] information.  Pt's nurse, Diane, has been provided with shelter information to give to the pt at the time of discharge, and has been notified of pt's status.  Doylene Canninghomas Danyal Adorno, MA Triage Specialist [phone]

## 2016-04-23 NOTE — Discharge Instructions (Signed)
We have seen you in the ED today with swelling in the feet. We applied ACE bandages. We recommend you repeat this at home and keep the feet elevated. Follow up with your primary care doctor and psychiatrist. I have copied some information below regarding how to follow up with a primary physician as an outpatient.   RESOURCE GUIDE  Chronic Pain Problems: Contact [facility]  [phone] Patients need to be referred by their primary care doctor.  Insufficient Money for Medicine: Contact [employer]:  call [phone]  No Primary Care Doctor: - Call [facility]  [phone] - can help you locate a primary care doctor that  accepts your insurance, provides certain services, etc. - Physician Referral Service- [phone]  Agencies that provide inexpensive medical care: - [facility]  [phone] - [facility]  [phone] - [facility]  [phone] - Women's Clinic  [phone] - [facility] Parenthood  [phone] [facility]  [phone]  Medicaid-accepting [facility] Providers: - [facility]- [address]  [phone], Mon-Fri 9am-7pm, Sat 9am-1pm - [facility]- [address] [place], [address] [place]  [phone] - [facility]- [address] [place]  [phone] [facility]- [address]  [phone] - Renaye Rakers- [address] [facility], [address], [phone]  Only accepts [place] patients after they have their name  applied to their card  Self Pay (no insurance) in [place]: - Sickle Cell Patients - [facility]  [address] [place], [phone] - [facility]- [address] [place]  [phone]       [facility]- [address]       -     [facility]- see information above (Speak to [employer] if you do not have insurance)       -  [facility]- [address],  [phone]       -  [facility]- [address] [phone]       -  Dr Julio Sicks-  [address], [place], [phone]       -  [facility] - [address], [phone]       -  [facility]- [address], [phone], also [address] [phone]       -     [facility]- [address] [place], [phone], 1st & 3rd Saturday         every month, 10am-1pm  -     [facility]   [address] [place], [place].   Phone:  [phone], Fax:  [phone]. Hours of Operation:  9 am - 6 pm, M-F.  -     [facility]   [address], [place]   Phone: [phone], Fax: [phone]. Hours of Operation:  8:30 am - 5:30 pm, M-F.    Dental Assistance If unable to pay or uninsured, contact:  [facility] to become qualified for the adult dental clinic.  Patients with Medicaid: [facility] [phone] [address], [phone] [address], [phone]  If unable to pay, or uninsured, contact [facility] [phone] in [place],  [phone] in [facility]) to become qualified for the adult dental clinic  [facility] [address] [place], [place] [address] [phone] [email or web address]  Other Proofreader[employer]-Cost Community Dental Services: - [facility]- [address], [place], [place], [phone], [phone], [address]. [phone], 2nd and 4th Thursday of the month at 6:30am.  10 clients each day by appointment, can sometimes see walk-in patients if someone does not show for an appointment. [facility]- [address], [place], [place], [phone], [phone] - [facility]- [address], [place], [place], [address], [phone] [facility]- [phone] [facility]- [phone] [phone]-  [phone]     Edema Edema is an abnormal buildup of fluids. It is more common in your legs and thighs. Painless swelling of the feet and ankles is more likely as a person ages. It also is common in looser skin, like around your eyes. HOME CARE   Keep the affected body part above the level of the heart while lying down.  Do not sit still or stand for a Geremy Rister time.  Do not put anything right under your knees when you lie down.  Do not wear tight clothes on your upper legs.  Exercise your legs to help the puffiness (swelling) go down.  Wear elastic bandages or support stockings as told by your doctor.  A low-salt diet may help lessen the puffiness.  Only take medicine as told by your doctor. GET HELP IF:  Treatment is not working.  You have heart, liver, or kidney disease and notice that your skin looks puffy or shiny.  You have puffiness in your legs that does not get better when you raise your legs.  You have sudden weight gain for no reason. GET HELP RIGHT AWAY IF:   You have shortness of breath or chest pain.  You cannot breathe when you lie down.  You have pain, redness, or warmth in the areas that are puffy.  You have heart, liver, or kidney disease and get edema all of a sudden.  You have a fever and your symptoms get worse all of a sudden. MAKE SURE YOU:   Understand these instructions.  Will watch your condition.  Will get help right away if you are not doing well or get worse.   This information is not intended to replace advice given to you by your health care provider. Make sure you discuss any questions you have with your health care provider.   Document Released: 03/19/2008 Document Revised: 10/06/2013 Document Reviewed: 07/24/2013 Elsevier Interactive Patient Education [employer]2016 Elsevier Inc.

## 2016-04-23 NOTE — Discharge Instructions (Signed)
For your ongoing mental health needs, you are advised to follow up with [facility].  New and returning patients are seen at their walk-in clinic.  Walk-in hours are Monday - Friday from 8:00 am - 3:00 pm.  Walk-in patients are seen on a first come, first served basis.  Try to arrive as early as possible for he best chance of being seen the same day: ° °     [facility] °     [address] °     [place], [place] [address] °     [phone] °
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
## 2016-04-23 NOTE — ED Notes (Addendum)
Pt was seen here yesterday for bilateral foot swelling and SI. Pt reports ongoing foot swelling. Has not had a chance to prop feet up this afternoon. Pt ambulatory. Still reports ongoing SI. Was cleared by psychiatry this am.
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
## 2016-04-28 ENCOUNTER — Emergency Department (HOSPITAL_BASED_OUTPATIENT_CLINIC_OR_DEPARTMENT_OTHER)
Admit: 2016-04-28 | Discharge: 2016-04-28 | Disposition: A | Discharge status: Home / Self Care | Payer: Self-pay | Attending: Emergency Medicine | Admitting: Emergency Medicine

## 2016-04-28 ENCOUNTER — Encounter (HOSPITAL_COMMUNITY): Payer: Self-pay | Admitting: Emergency Medicine

## 2016-04-28 ENCOUNTER — Emergency Department (HOSPITAL_COMMUNITY)
Admission: EM | Admit: 2016-04-28 | Discharge: 2016-04-28 | Disposition: A | Discharge status: Home / Self Care | Payer: Self-pay | Attending: Emergency Medicine | Admitting: Emergency Medicine

## 2016-04-28 DIAGNOSIS — M7989 Other specified soft tissue disorders: Secondary | ICD-10-CM

## 2016-04-28 DIAGNOSIS — R600 Localized edema: Secondary | ICD-10-CM | POA: Insufficient documentation

## 2016-04-28 DIAGNOSIS — F17200 Nicotine dependence, unspecified, uncomplicated: Secondary | ICD-10-CM | POA: Insufficient documentation

## 2016-04-28 DIAGNOSIS — Z79899 Other long term (current) drug therapy: Secondary | ICD-10-CM | POA: Insufficient documentation

## 2016-04-28 LAB — BASIC METABOLIC PANEL
Anion gap: 7 (ref 5–15)
BUN: 7 mg/dL (ref 6–20)
CHLORIDE: 100 mmol/L — AB (ref 101–111)
CO2: 32 mmol/L (ref 22–32)
Calcium: 9.2 mg/dL (ref 8.9–10.3)
Creatinine, Ser: 0.84 mg/dL (ref 0.44–1.00)
GFR calc non Af Amer: >60 mL/min (ref >60)
GLUCOSE: 88 mg/dL (ref 65–99)
POTASSIUM: 3.3 mmol/L — AB (ref 3.5–5.1)
SODIUM: 139 mmol/L (ref 135–145)

## 2016-04-28 LAB — CBC WITH DIFFERENTIAL/PLATELET
Basophils Absolute: 0 10*3/uL (ref 0.0–0.1)
Basophils Relative: 0 %
EOS PCT: 2 %
Eosinophils Absolute: 0.1 10*3/uL (ref 0.0–0.7)
HCT: 33.5 % — ABNORMAL LOW (ref 36.0–46.0)
Hemoglobin: 11 g/dL — ABNORMAL LOW (ref 12.0–15.0)
LYMPHS ABS: 1.8 10*3/uL (ref 0.7–4.0)
LYMPHS PCT: 39 %
MCH: 29.9 pg (ref 26.0–34.0)
MCHC: 32.8 g/dL (ref 30.0–36.0)
MCV: 91 fL (ref 78.0–100.0)
MONO ABS: 0.7 10*3/uL (ref 0.1–1.0)
Monocytes Relative: 16 %
Neutro Abs: 1.9 10*3/uL (ref 1.7–7.7)
Neutrophils Relative %: 43 %
PLATELETS: 209 10*3/uL (ref 150–400)
RBC: 3.68 MIL/uL — AB (ref 3.87–5.11)
RDW: 15.1 % (ref 11.5–15.5)
WBC: 4.6 10*3/uL (ref 4.0–10.5)

## 2016-04-28 LAB — D-DIMER, QUANTITATIVE: D-Dimer, Quant: 1.45 ug/mL-FEU — ABNORMAL HIGH (ref 0.00–0.50)

## 2016-04-28 MED ORDER — POTASSIUM CHLORIDE CRYS ER 20 MEQ PO TBCR: 20.0000 meq | EXTENDED_RELEASE_TABLET | Freq: Every day | ORAL | Status: DC

## 2016-04-28 MED ORDER — FUROSEMIDE 20 MG PO TABS: 20.0000 mg | ORAL_TABLET | Freq: Every day | ORAL | Status: DC

## 2016-04-28 MED ORDER — POTASSIUM CHLORIDE CRYS ER 20 MEQ PO TBCR
40.0000 meq | EXTENDED_RELEASE_TABLET | Freq: Once | ORAL | Status: AC
  Administered 2016-04-28: 40 meq via ORAL
  Filled 2016-04-28: qty 2

## 2016-04-28 MED ORDER — ENOXAPARIN SODIUM 100 MG/ML ~~LOC~~ SOLN
1.0000 mg/kg | Freq: Once | SUBCUTANEOUS | Status: AC
  Administered 2016-04-28: 90 mg via SUBCUTANEOUS
  Filled 2016-04-28: qty 1

## 2016-04-28 MED ORDER — FUROSEMIDE 10 MG/ML IJ SOLN
40.0000 mg | Freq: Once | INTRAMUSCULAR | Status: AC
  Administered 2016-04-28: 40 mg via INTRAVENOUS
  Filled 2016-04-28: qty 4

## 2016-04-28 NOTE — ED Notes (Signed)
Per US Tech, Pt negative for DVT in BLE.

## 2016-04-28 NOTE — ED Provider Notes (Addendum)
CSN: [id number]     Arrival date & time 04/28/16  0034 History   By signing my name below, I, Suzan SlickAshley N. Elon SpannerLeger, [id number] that this documentation has been prepared under the direction and in the presence of Rolan BuccoMelanie Amanuel Sinkfield, MD.  Electronically Signed: Suzan SlickAshley N. Elon SpannerLeger, ED Scribe. 04/28/2016. 4:10 AM.   Chief Complaint  Patient presents with  . Leg Swelling   The history is provided by the patient. No language interpreter was used.    HPI Comments: Sydney Gonzales is a 60 y.o. female without any pertinent past medical history who presents to the Emergency Department complaining of constant, worsening leg swelling with associated pain x 1 week. No recent injury or trauma. Discomfort to legs exacerbated with ambulation and deep palpation. No alleviating factors at this time. No OTC/prescribed medications attempted prior to arrival. No recent fever, chills, shortness of breath, or chest pain. Pt was recently evaluated in the Emergency Department on 7/10 for same but states swelling has not improved. No prior history of blood clots.She states she's had swelling in her legs in the past but not as bad. She's currently been staying in homeless shelters.  PCP: Default, Provider, MD    Past Medical History  Diagnosis Date  . Chronic back pain   . Migraine   . Chronic abdominal pain   . Hepatitis C   . Abdominal adhesions   . Anxiety    Past Surgical History  Procedure Laterality Date  . Gallbladder surgery    . Abdominal hysterectomy    . Appendectomy    . Cholecystectomy     No family history on file. Social History  Substance Use Topics  . Smoking status: Current Every Day Smoker -- 0.50 packs/day  . Smokeless tobacco: Never Used  . Alcohol Use: No   OB History    No data available     Review of Systems  Constitutional: Negative for fever, chills, diaphoresis and fatigue.  HENT: Negative for congestion, rhinorrhea and sneezing.   Eyes: Negative.   Respiratory: Negative for cough, chest  tightness and shortness of breath.   Cardiovascular: Positive for leg swelling. Negative for chest pain.  Gastrointestinal: Negative for nausea, vomiting, abdominal pain, diarrhea and blood in stool.  Genitourinary: Negative for frequency, hematuria, flank pain and difficulty urinating.  Musculoskeletal: Negative for back pain and arthralgias.  Skin: Negative for rash.  Neurological: Negative for dizziness, speech difficulty, weakness, numbness and headaches.      Allergies  Shellfish allergy; Stadol; Talwin; Toradol; Nalbuphine; Ivp dye; and Morphine and related  Home Medications   Prior to Admission medications   Medication Sig Start Date End Date Taking? Authorizing Provider  busPIRone (BUSPAR) 10 MG tablet Take 1 tablet (10 mg total) by mouth 3 (three) times daily. 03/21/16  Yes Adonis BrookSheila Agustin, NP  clonazePAM (KLONOPIN) 0.5 MG tablet Take 1 tablet (0.5 mg total) by mouth 2 (two) times daily as needed (anxeity). Patient taking differently: Take 1 mg by mouth 2 (two) times daily as needed (anxeity).  03/21/16  Yes Adonis BrookSheila Agustin, NP  PARoxetine (PAXIL) 20 MG tablet Take 1 tablet (20 mg total) by mouth daily. 03/21/16  Yes Adonis BrookSheila Agustin, NP  QUEtiapine (SEROQUEL) 100 MG tablet Take 1 tablet (100 mg total) by mouth at bedtime. 03/21/16  Yes Adonis BrookSheila Agustin, NP  QUEtiapine (SEROQUEL) 25 MG tablet Take 1 tablet (25 mg total) by mouth every morning. 03/21/16  Yes Adonis BrookSheila Agustin, NP  ciprofloxacin (CIPRO) 500 MG tablet Take 1 tablet (500 mg  total) by mouth 2 (two) times daily. One po bid x 7 days Patient not taking: Reported on 04/22/2016 03/24/16   Loren Racer, MD  furosemide (LASIX) 20 MG tablet Take 1 tablet (20 mg total) by mouth daily. 04/28/16   Rolan Bucco, MD  metroNIDAZOLE (FLAGYL) 500 MG tablet Take 1 tablet (500 mg total) by mouth 2 (two) times daily. One po bid x 7 days Patient not taking: Reported on 04/22/2016 03/24/16   Loren Racer, MD  ondansetron (ZOFRAN ODT) 4 MG disintegrating  tablet Take 1 tablet (4 mg total) by mouth every 8 (eight) hours as needed for nausea or vomiting. Patient not taking: Reported on 04/22/2016 03/30/16   Shawn C Joy, PA-C  ondansetron (ZOFRAN) 4 MG tablet Take 1 tablet (4 mg total) by mouth every 8 (eight) hours as needed for nausea or vomiting. Patient not taking: Reported on 04/23/2016 03/24/16   Loren Racer, MD  oxyCODONE-acetaminophen (PERCOCET/ROXICET) 5-325 MG tablet Take 1 tablet by mouth every 4 (four) hours as needed for severe pain. Patient not taking: Reported on 04/22/2016 03/30/16   Shawn C Joy, PA-C  potassium chloride SA (K-DUR,KLOR-CON) 20 MEQ tablet Take 1 tablet (20 mEq total) by mouth daily. 04/28/16   Rolan Bucco, MD   Triage Vitals: BP 125/75 mmHg  Pulse 87  Resp 19  SpO2 100%   Physical Exam  Constitutional: She is oriented to person, place, and time. She appears well-developed and well-nourished.  HENT:  Head: Normocephalic and atraumatic.  Eyes: Pupils are equal, round, and reactive to light.  Neck: Normal range of motion. Neck supple.  Cardiovascular: Normal rate, regular rhythm and normal heart sounds.   Pulmonary/Chest: Effort normal and breath sounds normal. No respiratory distress. She has no wheezes. She has no rales. She exhibits no tenderness.  Abdominal: Soft. Bowel sounds are normal. There is no tenderness. There is no rebound and no guarding.  Musculoskeletal: Normal range of motion. She exhibits edema.  3+ pitting edema bilaterally. There is mild erythema but no warmth. Pedal pulses are palpable. No wounds are noted.  Lymphadenopathy:    She has no cervical adenopathy.  Neurological: She is alert and oriented to person, place, and time.  Skin: Skin is warm and dry. No rash noted.  Psychiatric: She has a normal mood and affect.    ED Course  Procedures (including critical care time)  DIAGNOSTIC STUDIES: Oxygen Saturation is 98% on room air, normal by my interpretation.    COORDINATION OF  CARE: 4:02 AM-Discussed treatment plan with pt at bedside and pt agreed to plan.     Labs Review Labs Reviewed  BASIC METABOLIC PANEL - Abnormal; Notable for the following:    Potassium 3.3 (*)    Chloride 100 (*)    All other components within normal limits  CBC WITH DIFFERENTIAL/PLATELET - Abnormal; Notable for the following:    RBC 3.68 (*)    Hemoglobin 11.0 (*)    HCT 33.5 (*)    All other components within normal limits  D-DIMER, QUANTITATIVE (NOT AT [facility]) - Abnormal; Notable for the following:    D-Dimer, Quant 1.45 (*)    All other components within normal limits    Imaging Review No results found. I have personally reviewed and evaluated these images and lab results as part of my medical decision-making.   EKG Interpretation None      MDM   Final diagnoses:  Pedal edema    Patient presents with lower extremity edema. She has no  chest pain shortness of breath or other symptoms of congestive heart failure. She has diffuse edema and mild erythema. No warmth, fever or other suggestions of cellulitis. She was given a dose of Lasix in the ED. She was also given a dose of potassium. Her potassium is slightly low. Given her ongoing/worsening symptoms, I checked a d-dimer which is positive.  We are unable to get a vascular ultrasound tonight. She was discharged around 7 AM in the morning and I did try to convince the patient to stay and have the vascular ultrasound before she is discharged in the morning. However patient is refusing to stay. She was given 1 dose of Lovenox and an outpatient vascular study was ordered. She was given a three-day supply of Lasix and potassium. She was encouraged to establish care with a PCP. Return precautions were given.  1610 pt is not wanting to stay for u/s.  Will turn over care to Dr. Fayrene Fearing  I personally performed the services described in this documentation, which was scribed in my presence.  The recorded information has been reviewed and  considered.   Rolan Bucco, MD 04/28/16 [id number]  Rolan Bucco, MD 04/28/16 [phone]

## 2016-04-28 NOTE — ED Notes (Addendum)
Verbalized understanding discharge instructions, prescriptions, and follow up. In no acute distress.  Pt given a bus pass.  Refused a wheelchair.

## 2016-04-28 NOTE — Progress Notes (Signed)
Preliminary results by tech - Venous Duplex Lower Ext. Completed. Negative for deep and superficial vein thrombosis in both legs.  Miyonna Ormiston, BS, RDMS, RVT  

## 2016-04-28 NOTE — ED Notes (Signed)
US at bedside

## 2016-04-28 NOTE — ED Notes (Signed)
Pt from home with complaints of bilateral lower leg swelling. Pt was recently evaluated here for the same. Pt states she has pain in both legs, but pulses are palpable

## 2016-04-28 NOTE — ED Provider Notes (Signed)
Patient seen and evaluated. Results of Doppler discussed with patient. Plans discharge home with Lasix, follow-up instructions.  Rolland PorterMark Rondy Krupinski, MD 04/28/16 1134

## 2016-04-28 NOTE — Discharge Instructions (Signed)
Edema °Edema is an abnormal buildup of fluids in your body tissues. Edema is somewhat dependent on gravity to pull the fluid to the lowest place in your body. That makes the condition more common in the legs and thighs (lower extremities). Painless swelling of the feet and ankles is common and becomes more likely as you get older. It is also common in looser tissues, like around your eyes.  °When the affected area is squeezed, the fluid may move out of that spot and leave a dent for a few moments. This dent is called pitting.  °CAUSES  °There are many possible causes of edema. Eating too much salt and being on your feet or sitting for a long time can cause edema in your legs and ankles. Hot weather may make edema worse. Common medical causes of edema include: °· Heart failure. °· Liver disease. °· Kidney disease. °· Weak blood vessels in your legs. °· Cancer. °· An injury. °· Pregnancy. °· Some medications. °· Obesity.  °SYMPTOMS  °Edema is usually painless. Your skin may look swollen or shiny.  °DIAGNOSIS  °Your health care provider may be able to diagnose edema by asking about your medical history and doing a physical exam. You may need to have tests such as X-rays, an electrocardiogram, or blood tests to check for medical conditions that may cause edema.  °TREATMENT  °Edema treatment depends on the cause. If you have heart, liver, or kidney disease, you need the treatment appropriate for these conditions. General treatment may include: °· Elevation of the affected body part above the level of your heart. °· Compression of the affected body part. Pressure from elastic bandages or support stockings squeezes the tissues and forces fluid back into the blood vessels. This keeps fluid from entering the tissues. °· Restriction of fluid and salt intake. °· Use of a water pill (diuretic). These medications are appropriate only for some types of edema. They pull fluid out of your body and make you urinate more often. This  gets rid of fluid and reduces swelling, but diuretics can have side effects. Only use diuretics as directed by your health care provider. °HOME CARE INSTRUCTIONS  °· Keep the affected body part above the level of your heart when you are lying down.   °· Do not sit still or stand for prolonged periods.   °· Do not put anything directly under your knees when lying down. °· Do not wear constricting clothing or garters on your upper legs.   °· Exercise your legs to work the fluid back into your blood vessels. This may help the swelling go down.   °· Wear elastic bandages or support stockings to reduce ankle swelling as directed by your health care provider.   °· Eat a low-salt diet to reduce fluid if your health care provider recommends it.   °· Only take medicines as directed by your health care provider.  °SEEK MEDICAL CARE IF:  °· Your edema is not responding to treatment. °· You have heart, liver, or kidney disease and notice symptoms of edema. °· You have edema in your legs that does not improve after elevating them.   °· You have sudden and unexplained weight gain. °SEEK IMMEDIATE MEDICAL CARE IF:  °· You develop shortness of breath or chest pain.   °· You cannot breathe when you lie down. °· You develop pain, redness, or warmth in the swollen areas.   °· You have heart, liver, or kidney disease and suddenly get edema. °· You have a fever and your symptoms suddenly get worse. °MAKE SURE YOU:  °·   Understand these instructions. °· Will watch your condition. °· Will get help right away if you are not doing well or get worse. °  °This information is not intended to replace advice given to you by your health care provider. Make sure you discuss any questions you have with your health care provider. °  °Document Released: 10/01/2005 Document Revised: 10/22/2014 Document Reviewed: 07/24/2013 °Elsevier Interactive Patient Education ©2016 [employer] ° °

## 2016-04-28 NOTE — ED Notes (Signed)
Pt has changed her mind regarding discharge.  Sts "I better stay and get this done."  Belfie MD made aware.  Pt placed back into room and changing back into her gown.

## 2016-05-08 DIAGNOSIS — R600 Localized edema: Secondary | ICD-10-CM | POA: Insufficient documentation

## 2016-05-08 DIAGNOSIS — F17200 Nicotine dependence, unspecified, uncomplicated: Secondary | ICD-10-CM | POA: Insufficient documentation

## 2016-05-08 DIAGNOSIS — R21 Rash and other nonspecific skin eruption: Secondary | ICD-10-CM | POA: Insufficient documentation

## 2016-05-09 ENCOUNTER — Encounter (HOSPITAL_COMMUNITY): Payer: Self-pay | Admitting: Emergency Medicine

## 2016-05-09 ENCOUNTER — Emergency Department (HOSPITAL_COMMUNITY): Admit: 2016-05-09 | Payer: Self-pay

## 2016-05-09 ENCOUNTER — Emergency Department (HOSPITAL_COMMUNITY)
Admission: EM | Admit: 2016-05-09 | Discharge: 2016-05-09 | Disposition: A | Discharge status: Home / Self Care | Payer: Self-pay | Attending: Emergency Medicine | Admitting: Emergency Medicine

## 2016-05-09 ENCOUNTER — Emergency Department (HOSPITAL_BASED_OUTPATIENT_CLINIC_OR_DEPARTMENT_OTHER): Admit: 2016-05-09 | Discharge: 2016-05-09 | Disposition: A | Discharge status: Home / Self Care | Payer: Self-pay

## 2016-05-09 ENCOUNTER — Encounter (HOSPITAL_COMMUNITY): Payer: Self-pay

## 2016-05-09 DIAGNOSIS — F17200 Nicotine dependence, unspecified, uncomplicated: Secondary | ICD-10-CM | POA: Insufficient documentation

## 2016-05-09 DIAGNOSIS — M7989 Other specified soft tissue disorders: Secondary | ICD-10-CM | POA: Insufficient documentation

## 2016-05-09 LAB — COMPREHENSIVE METABOLIC PANEL
ALBUMIN: 3.3 g/dL — AB (ref 3.5–5.0)
ALK PHOS: 58 U/L (ref 38–126)
ALT: 16 U/L (ref 14–54)
ANION GAP: 7 (ref 5–15)
AST: 40 U/L (ref 15–41)
BUN: 7 mg/dL (ref 6–20)
CHLORIDE: 100 mmol/L — AB (ref 101–111)
CO2: 30 mmol/L (ref 22–32)
CREATININE: 0.95 mg/dL (ref 0.44–1.00)
Calcium: 9.4 mg/dL (ref 8.9–10.3)
GFR calc non Af Amer: >60 mL/min (ref >60)
GLUCOSE: 86 mg/dL (ref 65–99)
Potassium: 3.5 mmol/L (ref 3.5–5.1)
SODIUM: 137 mmol/L (ref 135–145)
Total Bilirubin: 0.3 mg/dL (ref 0.3–1.2)
Total Protein: 6.9 g/dL (ref 6.5–8.1)

## 2016-05-09 LAB — CBC WITH DIFFERENTIAL/PLATELET
BASOS PCT: 0 %
Basophils Absolute: 0 10*3/uL (ref 0.0–0.1)
EOS ABS: 0.2 10*3/uL (ref 0.0–0.7)
EOS PCT: 5 %
HCT: 33.9 % — ABNORMAL LOW (ref 36.0–46.0)
HEMOGLOBIN: 10.7 g/dL — AB (ref 12.0–15.0)
LYMPHS ABS: 2 10*3/uL (ref 0.7–4.0)
Lymphocytes Relative: 46 %
MCH: 29.2 pg (ref 26.0–34.0)
MCHC: 31.6 g/dL (ref 30.0–36.0)
MCV: 92.6 fL (ref 78.0–100.0)
Monocytes Absolute: 0.7 10*3/uL (ref 0.1–1.0)
Monocytes Relative: 17 %
NEUTROS PCT: 32 %
Neutro Abs: 1.4 10*3/uL — ABNORMAL LOW (ref 1.7–7.7)
PLATELETS: 206 10*3/uL (ref 150–400)
RBC: 3.66 MIL/uL — AB (ref 3.87–5.11)
RDW: 15.2 % (ref 11.5–15.5)
WBC: 4.3 10*3/uL (ref 4.0–10.5)

## 2016-05-09 LAB — I-STAT CG4 LACTIC ACID, ED: Lactic Acid, Venous: 1.44 mmol/L (ref 0.5–1.9)

## 2016-05-09 NOTE — Progress Notes (Signed)
CM called [facility] and confirmed that patient would be able to go back this evening and have her bed. Bus pass to be given to patient by the RN, [employer]. CM remains available for further discharge planning needs.

## 2016-05-09 NOTE — ED Notes (Signed)
Called patient's name but got no response.Marland Kitchen

## 2016-05-09 NOTE — ED Notes (Signed)
pts name called for a room no answer 

## 2016-05-09 NOTE — ED Provider Notes (Signed)
[facility]-EMERGENCY DEPT Provider Note   CSN: [id number] Arrival date & time: 05/08/16  2315  First Provider Contact:  First MD Initiated Contact with Patient 05/09/16 0507        History   Chief Complaint Chief Complaint  Patient presents with  . Cellulitis    HPI Sydney Gonzales is a 60 y.o. female  The history is provided by the patient.  Pt comes in with cc of leg swelling. Leg swelling has been present for several days now, this is patient's 3rd ER visit for the same this month. Previous workup for bilateral lower extremity swelling has revealed normal BNP, normal albumin. PT had elevated dimer, but she never got the outpatient DVT study. Pt has no n/v/f/c. Pt has pain in her legs with walking. She is an active lady, and has no hx of DVT or risk factors for the same.   ROS 10 Systems reviewed and are negative for acute change except as noted in the HPI.      Past Medical History:  Diagnosis Date  . Abdominal adhesions   . Anxiety   . Chronic abdominal pain   . Chronic back pain   . Hepatitis C   . Migraine     Patient Active Problem List   Diagnosis Date Noted  . Bipolar affective disorder, depressed, severe (HCC) 03/17/2016  . Anxiety     Past Surgical History:  Procedure Laterality Date  . ABDOMINAL HYSTERECTOMY    . APPENDECTOMY    . CHOLECYSTECTOMY    . GALLBLADDER SURGERY      OB History    No data available       Home Medications    Prior to Admission medications   Medication Sig Start Date End Date Taking? Authorizing Provider  busPIRone (BUSPAR) 10 MG tablet Take 1 tablet (10 mg total) by mouth 3 (three) times daily. 03/21/16   Adonis Brook, NP  ciprofloxacin (CIPRO) 500 MG tablet Take 1 tablet (500 mg total) by mouth 2 (two) times daily. One po bid x 7 days Patient not taking: Reported on 04/22/2016 03/24/16   Loren Racer, MD  clonazePAM (KLONOPIN) 0.5 MG tablet Take 1 tablet (0.5 mg total) by mouth 2 (two) times daily as needed  (anxeity). Patient taking differently: Take 1 mg by mouth 2 (two) times daily as needed (anxeity).  03/21/16   Adonis Brook, NP  furosemide (LASIX) 20 MG tablet Take 1 tablet (20 mg total) by mouth daily. 04/28/16   Rolan Bucco, MD  metroNIDAZOLE (FLAGYL) 500 MG tablet Take 1 tablet (500 mg total) by mouth 2 (two) times daily. One po bid x 7 days Patient not taking: Reported on 04/22/2016 03/24/16   Loren Racer, MD  ondansetron (ZOFRAN ODT) 4 MG disintegrating tablet Take 1 tablet (4 mg total) by mouth every 8 (eight) hours as needed for nausea or vomiting. Patient not taking: Reported on 04/22/2016 03/30/16   Shawn C Joy, PA-C  ondansetron (ZOFRAN) 4 MG tablet Take 1 tablet (4 mg total) by mouth every 8 (eight) hours as needed for nausea or vomiting. Patient not taking: Reported on 04/23/2016 03/24/16   Loren Racer, MD  oxyCODONE-acetaminophen (PERCOCET/ROXICET) 5-325 MG tablet Take 1 tablet by mouth every 4 (four) hours as needed for severe pain. Patient not taking: Reported on 04/22/2016 03/30/16   Shawn C Joy, PA-C  PARoxetine (PAXIL) 20 MG tablet Take 1 tablet (20 mg total) by mouth daily. 03/21/16   Adonis Brook, NP  potassium chloride SA (K-DUR,KLOR-CON) 20  MEQ tablet Take 1 tablet (20 mEq total) by mouth daily. 04/28/16   Rolan Bucco, MD  QUEtiapine (SEROQUEL) 100 MG tablet Take 1 tablet (100 mg total) by mouth at bedtime. 03/21/16   Adonis Brook, NP  QUEtiapine (SEROQUEL) 25 MG tablet Take 1 tablet (25 mg total) by mouth every morning. 03/21/16   Adonis Brook, NP    Family History No family history on file.  Social History Social History  Substance Use Topics  . Smoking status: Current Every Day Smoker    Packs/day: 0.50  . Smokeless tobacco: Never Used  . Alcohol use No     Allergies   Shellfish allergy; Stadol [butorphanol tartrate]; Talwin [pentazocine]; Toradol [ketorolac tromethamine]; Nalbuphine; Ivp dye [iodinated diagnostic agents]; and Morphine and related   Review  of Systems Review of Systems   Physical Exam Updated Vital Signs BP (!) 112/54   Pulse 70   Temp 98.1 F (36.7 C)   Resp 19   Ht 5\' 6"  (1.676 m)   Wt 190 lb (86.2 kg)   SpO2 100%   BMI 30.67 kg/m   Physical Exam  Constitutional: She is oriented to person, place, and time. She appears well-developed and well-nourished.  HENT:  Head: Normocephalic and atraumatic.  Eyes: EOM are normal. Pupils are equal, round, and reactive to light.  Neck: Neck supple.  Cardiovascular: Normal rate, regular rhythm and normal heart sounds.   No murmur heard. Pulmonary/Chest: Effort normal. No respiratory distress.  Abdominal: Soft. She exhibits no distension. There is no tenderness. There is no rebound and no guarding.  Musculoskeletal: She exhibits edema.  Bilateral lower extremity swelling, R > L. 2+ DP  Neurological: She is alert and oriented to person, place, and time.  Skin: Skin is warm and dry. Rash noted. There is erythema.  Nursing note and vitals reviewed.    ED Treatments / Results  Labs (all labs ordered are listed, but only abnormal results are displayed) Labs Reviewed  CBC WITH DIFFERENTIAL/PLATELET - Abnormal; Notable for the following:       Result Value   RBC 3.66 (*)    Hemoglobin 10.7 (*)    HCT 33.9 (*)    Neutro Abs 1.4 (*)    All other components within normal limits  COMPREHENSIVE METABOLIC PANEL - Abnormal; Notable for the following:    Chloride 100 (*)    Albumin 3.3 (*)    All other components within normal limits  I-STAT CG4 LACTIC ACID, ED    EKG  EKG Interpretation None       Radiology No results found.  Procedures Procedures (including critical care time)  Medications Ordered in ED Medications - No data to display   Initial Impression / Assessment and Plan / ED Course  I have reviewed the triage vital signs and the nursing notes.  Pertinent labs & imaging results that were available during my care of the patient were reviewed by me  and considered in my medical decision making (see chart for details).  Clinical Course      Final Clinical Impressions(s) / ED Diagnoses   Pt comes in with cc of leg swelling. The legs are swollen and red, no blisters. No warmth to touch. + 1+ pitting. No concerns for cellulitis. DVT will be ruled out with [place]. If DVT is neg, will treat as venous stasis or lymphedema dermatitis.   Final diagnoses:  None    New Prescriptions New Prescriptions   No medications on file  Derwood Kaplan, MD 05/09/16 [phone]

## 2016-05-09 NOTE — ED Notes (Signed)
Patient verbalized understanding of discharge instructions and denies any further needs or questions at this time. VS stable. Patient ambulatory with steady gait, bus pass given.

## 2016-05-09 NOTE — ED Notes (Signed)
PA-C at bedside. Vascular called, pt ready to go for DVT study.

## 2016-05-09 NOTE — Discharge Instructions (Signed)
Continue taking your home medications as prescribed. Please follow up with a primary care provider from the Resource Guide provided below in 5-7 days for further management and follow up Please return to the Emergency Department if symptoms worsen or new onset of fever, chest pain, shortness of breath, cough, wheezing, abdominal pain, vomiting, blood in urine or stool, worsening swelling, redness, warmth, drainage.

## 2016-05-09 NOTE — ED Notes (Signed)
Called her name for Triage no answer.Marland Kitchen

## 2016-05-09 NOTE — Progress Notes (Signed)
*  Preliminary Results* Bilateral lower extremity venous duplex completed. Bilateral lower extremities are negative for deep vein thrombosis. There is no evidence of Baker's cyst bilaterally.  05/09/2016 7:02 PM Gertie Fey, B.S., RVT, RDCS, RDMS

## 2016-05-09 NOTE — ED Notes (Signed)
Assisted patient to restroom in wheelchair - patient slow to stand and walk d/t pain in both lower legs, mostly right. PA aware.

## 2016-05-09 NOTE — ED Notes (Signed)
Called Patients name and didn't get a response.Sydney Gonzales

## 2016-05-09 NOTE — ED Triage Notes (Signed)
Patient reports worsening bilateral lower legs pain / swelling onset last week , denies fever or chills .

## 2016-05-09 NOTE — ED Provider Notes (Signed)
[facility]PT Provider Note   CSN: [id number] Arrival date & time: 05/09/16  1436  First Provider Contact:  First MD Initiated Contact with Patient 05/09/16 1756        History   Chief Complaint Chief Complaint  Patient presents with  . Leg Swelling    ultrasound appt missed/leg pain    HPI Sydney Gonzales is a 60 y.o. female.  Pt is a 60 yo female with PMH of Hep C who presents to the ED with complaint of bilateral leg swelling, onset 1 month. Pt reports having gradually worsening swelling to her legs over the past month. Pt also endorses pain to her lower legs which she notes is worse with ambulating and notes she has two wounds on the back of her heel with a small amount of clear yellow drainage noted. Denies fever, chills, HA, cough, SOB, wheezing, CP, abdominal pain, N/V, urinary sxs, numbness, tingling, weakness. She notes she is seen at the Methadone clinic and was started on lasix without improvement. She states she was recently started on Chlortalidone and notes she has taken 2 doses.   Pt reports this is her 4th visit to the ED for leg swelling. Chart review shows previous workup for BLE swelling with normal BNP, normal albumin and elevated d-dimer. Pt was originially scheduled to have outpatient DVT study performed but never went to scheduled appointment. She was seen last night in the ED with plan of having outpatient DVT study due to pt needing to leave to go to her Methadone clinic appointment. Pt returned to the ED to have her DVT study the clinic was unable to perform the procedure due to missing her appointment resulting in the pt returning to the ED.      Past Medical History:  Diagnosis Date  . Abdominal adhesions   . Anxiety   . Chronic abdominal pain   . Chronic back pain   . Hepatitis C   . Migraine     Patient Active Problem List   Diagnosis Date Noted  . Bipolar affective disorder, depressed, severe (HCC) 03/17/2016  . Anxiety     Past Surgical  History:  Procedure Laterality Date  . ABDOMINAL HYSTERECTOMY    . APPENDECTOMY    . CHOLECYSTECTOMY    . GALLBLADDER SURGERY      OB History    No data available       Home Medications    Prior to Admission medications   Medication Sig Start Date End Date Taking? Authorizing Provider  busPIRone (BUSPAR) 10 MG tablet Take 1 tablet (10 mg total) by mouth 3 (three) times daily. 03/21/16  Yes Adonis Brook, NP  chlorthalidone (HYGROTON) 50 MG tablet Take 50 mg by mouth daily.   Yes Historical Provider, MD  Cholecalciferol (VITAMIN D3) 2000 units capsule Take 2,000 Units by mouth daily.   Yes Historical Provider, MD  PARoxetine (PAXIL) 20 MG tablet Take 1 tablet (20 mg total) by mouth daily. 03/21/16  Yes Adonis Brook, NP  potassium chloride SA (K-DUR,KLOR-CON) 20 MEQ tablet Take 1 tablet (20 mEq total) by mouth daily. 04/28/16  Yes Rolan Bucco, MD  QUEtiapine (SEROQUEL) 50 MG tablet Take 50 mg by mouth at bedtime.   Yes Historical Provider, MD  vitamin B-12 (CYANOCOBALAMIN) 100 MCG tablet Take 100 mcg by mouth daily.   Yes Historical Provider, MD  ciprofloxacin (CIPRO) 500 MG tablet Take 1 tablet (500 mg total) by mouth 2 (two) times daily. One po bid x 7 days Patient  not taking: Reported on 04/22/2016 03/24/16   Loren Racer, MD  clonazePAM (KLONOPIN) 0.5 MG tablet Take 1 tablet (0.5 mg total) by mouth 2 (two) times daily as needed (anxeity). Patient not taking: Reported on 05/09/2016 03/21/16   Adonis Brook, NP  furosemide (LASIX) 20 MG tablet Take 1 tablet (20 mg total) by mouth daily. Patient not taking: Reported on 05/09/2016 04/28/16   Rolan Bucco, MD  metroNIDAZOLE (FLAGYL) 500 MG tablet Take 1 tablet (500 mg total) by mouth 2 (two) times daily. One po bid x 7 days Patient not taking: Reported on 04/22/2016 03/24/16   Loren Racer, MD  ondansetron (ZOFRAN ODT) 4 MG disintegrating tablet Take 1 tablet (4 mg total) by mouth every 8 (eight) hours as needed for nausea or  vomiting. Patient not taking: Reported on 04/22/2016 03/30/16   Shawn C Joy, PA-C  ondansetron (ZOFRAN) 4 MG tablet Take 1 tablet (4 mg total) by mouth every 8 (eight) hours as needed for nausea or vomiting. Patient not taking: Reported on 04/23/2016 03/24/16   Loren Racer, MD  oxyCODONE-acetaminophen (PERCOCET/ROXICET) 5-325 MG tablet Take 1 tablet by mouth every 4 (four) hours as needed for severe pain. Patient not taking: Reported on 04/22/2016 03/30/16   Shawn C Joy, PA-C  QUEtiapine (SEROQUEL) 100 MG tablet Take 1 tablet (100 mg total) by mouth at bedtime. Patient not taking: Reported on 05/09/2016 03/21/16   Adonis Brook, NP  QUEtiapine (SEROQUEL) 25 MG tablet Take 1 tablet (25 mg total) by mouth every morning. Patient not taking: Reported on 05/09/2016 03/21/16   Adonis Brook, NP    Family History No family history on file.  Social History Social History  Substance Use Topics  . Smoking status: Current Every Day Smoker    Packs/day: 0.50  . Smokeless tobacco: Never Used  . Alcohol use No     Allergies   Shellfish allergy; Stadol [butorphanol tartrate]; Talwin [pentazocine]; Toradol [ketorolac tromethamine]; Ivp dye [iodinated diagnostic agents]; Morphine and related; and Nalbuphine   Review of Systems Review of Systems  Cardiovascular: Positive for leg swelling (bilateral).  All other systems reviewed and are negative.    Physical Exam Updated Vital Signs BP 119/73 (BP Location: Left Arm)   Pulse 68   Temp 98 F (36.7 C) (Oral)   Resp 18   SpO2 100%   Physical Exam  Constitutional: She is oriented to person, place, and time. She appears well-developed and well-nourished.  HENT:  Head: Normocephalic and atraumatic.  Mouth/Throat: Oropharynx is clear and moist. No oropharyngeal exudate.  Eyes: Conjunctivae and EOM are normal. Right eye exhibits no discharge. Left eye exhibits no discharge. No scleral icterus.  Neck: Normal range of motion. Neck supple.   Cardiovascular: Normal rate, regular rhythm, normal heart sounds and intact distal pulses.   Pulmonary/Chest: Effort normal and breath sounds normal. No respiratory distress. She has no wheezes. She has no rales. She exhibits no tenderness.  Abdominal: Soft. Bowel sounds are normal. She exhibits no distension and no mass. There is no tenderness. There is no rebound and no guarding. No hernia.  Musculoskeletal: Normal range of motion. She exhibits edema (1+ pitting edema to bilateral LE, R>L). She exhibits no deformity.  BLE swelling with redness, no warmth. Small wounds noted to posterior heels with small amount of serous drainage noted on gauze dressing, no surrounding swelling, erythema or warmth. No signs of necrotic tissue. FROM of BLE at toes, feet, ankles and knees. 2+ DP pulses. Sensation grossly intact.  Neurological: She  is alert and oriented to person, place, and time.  Skin: Skin is warm and dry. Capillary refill takes less than 2 seconds.  Nursing note and vitals reviewed.    ED Treatments / Results  Labs (all labs ordered are listed, but only abnormal results are displayed) Labs Reviewed - No data to display  EKG  EKG Interpretation None       Radiology No results found.  Procedures Procedures (including critical care time)  Medications Ordered in ED Medications - No data to display   Initial Impression / Assessment and Plan / ED Course  I have reviewed the triage vital signs and the nursing notes.  Pertinent labs & imaging results that were available during my care of the patient were reviewed by me and considered in my medical decision making (see chart for details).  Clinical Course    Patient presents with bilateral leg swelling for the past month. This is the patient's fourth visit to the ED for leg swelling. She was scheduled to have an outpatient DVT that he performed this afternoon but missed her appointment resulting her coming to the ED. Patient was  on Lasix but notes she was recently switched to chlortalidone by her physician at methadone clinic. History of hepatitis. VSS. Exam revealed 1+ pitting edema to bilateral lower extremities, right worse than left with mild erythema and healing wounds noted to posterior heels, no signs of infection or associated cellulitis. Patient without symptoms of heart failure. Labs performed last night in the ED were unremarkable. DVT study negative and bilateral legs. Discussed results with patient. Plan to discharge patient home with outpatient piece P follow-up. Advised patient to continue taking her diuretic as prescribed. Discussed strict return precautions with patient.  Final Clinical Impressions(s) / ED Diagnoses   Final diagnoses:  Leg swelling    New Prescriptions New Prescriptions   No medications on file     Barrett Henle, Cordelia Poche 05/09/16 2141    Zadie Rhine, MD 05/10/16 [phone]

## 2016-05-09 NOTE — Discharge Instructions (Signed)
Please come back later today and ask for vascular department. You need bilateral leg [place] to make sure you don't have a blood clot.   See your doctor  Return to ER if you have worse leg swelling, chest pain, shortness of breath,

## 2016-05-09 NOTE — ED Notes (Signed)
PA-C states okay for patient to eat/drink. Provided [place] sandwich and water.

## 2016-05-09 NOTE — ED Provider Notes (Signed)
  Physical Exam  BP 119/73   Pulse 61   Temp 98.1 F (36.7 C)   Resp 16   Ht 5\' 6"  (1.676 m)   Wt 190 lb (86.2 kg)   SpO2 100%   BMI 30.67 kg/m   Physical Exam  ED Course  Procedures  MDM Patient care assumed at sign out. Patient had multiple visits for leg swelling. Tried lasix with no relief. Patient has bilateral leg swelling, no shortness of breath. Sign out pending bilateral DVT studies. Patient doesn't want to wait and wants to go to her methadone clinic to get methadone. Hemodynamically stable. Leg swelling for several weeks, low suspicion for DVT. Will order outpatient study, will hold off on lovenox for now.      Charlynne Pander, MD 05/09/16 [phone]

## 2016-06-03 ENCOUNTER — Encounter (HOSPITAL_COMMUNITY): Payer: Self-pay

## 2016-06-03 ENCOUNTER — Emergency Department (HOSPITAL_COMMUNITY)
Admission: EM | Admit: 2016-06-03 | Discharge: 2016-06-03 | Disposition: A | Discharge status: Home / Self Care | Payer: Self-pay | Attending: Emergency Medicine | Admitting: Emergency Medicine

## 2016-06-03 ENCOUNTER — Emergency Department (HOSPITAL_COMMUNITY): Payer: Self-pay

## 2016-06-03 DIAGNOSIS — F17200 Nicotine dependence, unspecified, uncomplicated: Secondary | ICD-10-CM | POA: Insufficient documentation

## 2016-06-03 DIAGNOSIS — Z79899 Other long term (current) drug therapy: Secondary | ICD-10-CM | POA: Insufficient documentation

## 2016-06-03 DIAGNOSIS — R1030 Lower abdominal pain, unspecified: Secondary | ICD-10-CM | POA: Insufficient documentation

## 2016-06-03 LAB — URINALYSIS, ROUTINE W REFLEX MICROSCOPIC
Bilirubin Urine: NEGATIVE
Glucose, UA: NEGATIVE mg/dL
Hgb urine dipstick: NEGATIVE
Ketones, ur: NEGATIVE mg/dL
Leukocytes, UA: NEGATIVE
Nitrite: NEGATIVE
Protein, ur: NEGATIVE mg/dL
Specific Gravity, Urine: 1.016 (ref 1.005–1.030)
pH: 5.5 (ref 5.0–8.0)

## 2016-06-03 LAB — COMPREHENSIVE METABOLIC PANEL
ALT: 25 U/L (ref 14–54)
AST: 41 U/L (ref 15–41)
Albumin: 3.5 g/dL (ref 3.5–5.0)
Alkaline Phosphatase: 68 U/L (ref 38–126)
Anion gap: 7 (ref 5–15)
BUN: 9 mg/dL (ref 6–20)
CO2: 29 mmol/L (ref 22–32)
Calcium: 8.9 mg/dL (ref 8.9–10.3)
Chloride: 100 mmol/L — ABNORMAL LOW (ref 101–111)
Creatinine, Ser: 0.82 mg/dL (ref 0.44–1.00)
GFR calc Af Amer: >60 mL/min (ref >60)
GFR calc non Af Amer: >60 mL/min (ref >60)
Glucose, Bld: 95 mg/dL (ref 65–99)
Potassium: 3 mmol/L — ABNORMAL LOW (ref 3.5–5.1)
Sodium: 136 mmol/L (ref 135–145)
Total Bilirubin: 0.8 mg/dL (ref 0.3–1.2)
Total Protein: 7.2 g/dL (ref 6.5–8.1)

## 2016-06-03 LAB — CBC
HCT: 36 % (ref 36.0–46.0)
Hemoglobin: 11.5 g/dL — ABNORMAL LOW (ref 12.0–15.0)
MCH: 29 pg (ref 26.0–34.0)
MCHC: 31.9 g/dL (ref 30.0–36.0)
MCV: 90.9 fL (ref 78.0–100.0)
Platelets: 161 10*3/uL (ref 150–400)
RBC: 3.96 MIL/uL (ref 3.87–5.11)
RDW: 14.7 % (ref 11.5–15.5)
WBC: 5.1 10*3/uL (ref 4.0–10.5)

## 2016-06-03 LAB — LIPASE, BLOOD: Lipase: 21 U/L (ref 11–51)

## 2016-06-03 MED ORDER — POTASSIUM CHLORIDE CRYS ER 20 MEQ PO TBCR
40.0000 meq | EXTENDED_RELEASE_TABLET | Freq: Once | ORAL | Status: AC
Start: 2016-06-03 — End: 2016-06-03
  Administered 2016-06-03: 40 meq via ORAL
  Filled 2016-06-03: qty 2

## 2016-06-03 MED ORDER — DICYCLOMINE HCL 20 MG PO TABS: 20.0000 mg | ORAL_TABLET | Freq: Two times a day (BID) | ORAL | 0 refills | Status: DC | PRN

## 2016-06-03 NOTE — ED Notes (Signed)
Called pt from lobby, no answer 

## 2016-06-03 NOTE — ED Notes (Signed)
Pt back in department transported to treatment.

## 2016-06-03 NOTE — ED Triage Notes (Signed)
Patient complains of mild abdominal pain and unable to control her bowels x 2 weeks, states that her bowel movements are formed and incontinent of same

## 2016-06-03 NOTE — ED Provider Notes (Signed)
[facility]-EMERGENCY DEPT Provider Note   CSN: [id number] Arrival date & time: 06/03/16  1025  By signing my name below, I, Doreatha MartinEva Mathews, attest that this documentation has been prepared under the direction and in the presence of Raeford RazorStephen Lilias Lorensen, MD. Electronically Signed: Doreatha MartinEva Mathews, ED Scribe. 06/03/16. 11:45 AM.     History   Chief Complaint Chief Complaint  Patient presents with  . Abdominal Pain    HPI Sydney Gonzales is a 60 y.o. female who presents to the Emergency Department complaining of intermittent bowel incontinence for 2 weeks with associated nausea. Pt reports that the stools have also been loose and nearly black, but denies any diarrhea. She also complains of a sensation of intermittent lower abdominal pain and fullness for 2 weeks. No worsening or alleviating factors noted. Pt reports no h/o similar incontinence prior to 2 weeks ago. She states her abdominal pain feels similar to prior h/o adhesions. She denies emesis, bloody stools, difficulty urinating, urinary incontinence, back pain, sensation of incomplete defecation. No h/o back surgeries or injury.     The history is provided by the patient. No language interpreter was used.    Past Medical History:  Diagnosis Date  . Abdominal adhesions   . Anxiety   . Chronic abdominal pain   . Chronic back pain   . Hepatitis C   . Migraine     Patient Active Problem List   Diagnosis Date Noted  . Bipolar affective disorder, depressed, severe (HCC) 03/17/2016  . Anxiety     Past Surgical History:  Procedure Laterality Date  . ABDOMINAL HYSTERECTOMY    . APPENDECTOMY    . CHOLECYSTECTOMY    . GALLBLADDER SURGERY      OB History    No data available       Home Medications    Prior to Admission medications   Medication Sig Start Date End Date Taking? Authorizing Provider  busPIRone (BUSPAR) 10 MG tablet Take 1 tablet (10 mg total) by mouth 3 (three) times daily. 03/21/16  Yes Adonis BrookSheila Agustin, NP  PARoxetine (PAXIL) 20  MG tablet Take 1 tablet (20 mg total) by mouth daily. 03/21/16  Yes Adonis BrookSheila Agustin, NP  chlorthalidone (HYGROTON) 50 MG tablet Take 50 mg by mouth daily.    Historical Provider, MD  Cholecalciferol (VITAMIN D3) 2000 units capsule Take 2,000 Units by mouth daily.    Historical Provider, MD  ciprofloxacin (CIPRO) 500 MG tablet Take 1 tablet (500 mg total) by mouth 2 (two) times daily. One po bid x 7 days Patient not taking: Reported on 04/22/2016 03/24/16   Loren Raceravid Yelverton, MD  clonazePAM (KLONOPIN) 0.5 MG tablet Take 1 tablet (0.5 mg total) by mouth 2 (two) times daily as needed (anxeity). Patient not taking: Reported on 05/09/2016 03/21/16   Adonis BrookSheila Agustin, NP  furosemide (LASIX) 20 MG tablet Take 1 tablet (20 mg total) by mouth daily. Patient not taking: Reported on 05/09/2016 04/28/16   Rolan BuccoMelanie Belfi, MD  metroNIDAZOLE (FLAGYL) 500 MG tablet Take 1 tablet (500 mg total) by mouth 2 (two) times daily. One po bid x 7 days Patient not taking: Reported on 04/22/2016 03/24/16   Loren Raceravid Yelverton, MD  ondansetron (ZOFRAN ODT) 4 MG disintegrating tablet Take 1 tablet (4 mg total) by mouth every 8 (eight) hours as needed for nausea or vomiting. Patient not taking: Reported on 04/22/2016 03/30/16   Shawn C Joy, PA-C  ondansetron (ZOFRAN) 4 MG tablet Take 1 tablet (4 mg total) by mouth every 8 (eight) hours  as needed for nausea or vomiting. Patient not taking: Reported on 04/23/2016 03/24/16   Loren Racer, MD  oxyCODONE-acetaminophen (PERCOCET/ROXICET) 5-325 MG tablet Take 1 tablet by mouth every 4 (four) hours as needed for severe pain. Patient not taking: Reported on 04/22/2016 03/30/16   Shawn C Joy, PA-C  potassium chloride SA (K-DUR,KLOR-CON) 20 MEQ tablet Take 1 tablet (20 mEq total) by mouth daily. 04/28/16   Rolan Bucco, MD  QUEtiapine (SEROQUEL) 100 MG tablet Take 1 tablet (100 mg total) by mouth at bedtime. Patient not taking: Reported on 05/09/2016 03/21/16   Adonis Brook, NP  QUEtiapine (SEROQUEL) 25 MG tablet  Take 1 tablet (25 mg total) by mouth every morning. Patient not taking: Reported on 05/09/2016 03/21/16   Adonis Brook, NP  QUEtiapine (SEROQUEL) 50 MG tablet Take 50 mg by mouth at bedtime.    Historical Provider, MD  vitamin B-12 (CYANOCOBALAMIN) 100 MCG tablet Take 100 mcg by mouth daily.    Historical Provider, MD    Family History No family history on file.  Social History Social History  Substance Use Topics  . Smoking status: Current Every Day Smoker    Packs/day: 0.50  . Smokeless tobacco: Never Used  . Alcohol use No     Allergies   Shellfish allergy; Stadol [butorphanol tartrate]; Talwin [pentazocine]; Toradol [ketorolac tromethamine]; Ivp dye [iodinated diagnostic agents]; Morphine and related; and Nalbuphine   Review of Systems Review of Systems  Gastrointestinal: Positive for abdominal pain and nausea. Negative for blood in stool, diarrhea and vomiting.       +bowel incontinence   Genitourinary: Negative for difficulty urinating and dysuria.  Musculoskeletal: Negative for back pain.  All other systems reviewed and are negative.    Physical Exam Updated Vital Signs BP 122/65   Pulse 76   Temp 97.8 F (36.6 C) (Oral)   Resp 16   Ht 5\' 6"  (1.676 m)   Wt 190 lb (86.2 kg)   SpO2 97%   BMI 30.67 kg/m   Physical Exam  Constitutional: She appears well-developed and well-nourished. No distress.  HENT:  Head: Normocephalic and atraumatic.  Mouth/Throat: Oropharynx is clear and moist. No oropharyngeal exudate.  Eyes: Conjunctivae and EOM are normal. Pupils are equal, round, and reactive to light. Right eye exhibits no discharge. Left eye exhibits no discharge. No scleral icterus.  Neck: Normal range of motion. Neck supple. No JVD present. No thyromegaly present.  Cardiovascular: Normal rate, regular rhythm, normal heart sounds and intact distal pulses.  Exam reveals no gallop and no friction rub.   No murmur heard. Pulmonary/Chest: Effort normal and breath  sounds normal. No respiratory distress. She has no wheezes. She has no rales.  Abdominal: Soft. Bowel sounds are normal. She exhibits no distension and no mass. There is tenderness. There is no guarding.  Mild suprapubic tenderness. No guarding.  Genitourinary:  Genitourinary Comments: Normal rectal tone. No lesions noted. No stool in the rectal vault. Pale brown stool noted on glove. Chaperone present throughout entire exam.    Musculoskeletal: Normal range of motion. She exhibits no edema or tenderness.  Lymphadenopathy:    She has no cervical adenopathy.  Neurological: She is alert. Coordination normal.  Skin: Skin is warm and dry. No rash noted. No erythema.  Psychiatric: She has a normal mood and affect. Her behavior is normal.  Nursing note and vitals reviewed.    ED Treatments / Results  Labs (all labs ordered are listed, but only abnormal results are displayed) Labs Reviewed  COMPREHENSIVE METABOLIC PANEL - Abnormal; Notable for the following:       Result Value   Potassium 3.0 (*)    Chloride 100 (*)    All other components within normal limits  CBC - Abnormal; Notable for the following:    Hemoglobin 11.5 (*)    All other components within normal limits  LIPASE, BLOOD  URINALYSIS, ROUTINE W REFLEX MICROSCOPIC (NOT [facility])    EKG  EKG Interpretation None       Radiology No results found.  Procedures Procedures (including critical care time)  DIAGNOSTIC STUDIES: Oxygen Saturation is 99% on RA, normal by my interpretation.    COORDINATION OF CARE: 11:41 AM Discussed treatment plan with pt at bedside which includes lab work and pt agreed to plan.    Medications Ordered in ED Medications - No data to display   Initial Impression / Assessment and Plan / ED Course  I have reviewed the triage vital signs and the nursing notes.  Pertinent labs & imaging results that were available during my care of the patient were reviewed by me and considered in my  medical decision making (see chart for details).  Clinical Course     Final Clinical Impressions(s) / ED Diagnoses   Final diagnoses:  Lower abdominal pain    New Prescriptions New Prescriptions   No medications on file    I personally preformed the services scribed in my presence. The recorded information has been reviewed is accurate. Raeford RazorStephen Tyrease Vandeberg, MD.    Raeford RazorStephen Ladasia Sircy, MD 06/07/16 [phone]

## 2016-06-14 ENCOUNTER — Emergency Department (HOSPITAL_COMMUNITY)
Admission: EM | Admit: 2016-06-14 | Discharge: 2016-06-15 | Disposition: A | Discharge status: Home / Self Care | Payer: Self-pay | Attending: Emergency Medicine | Admitting: Emergency Medicine

## 2016-06-14 ENCOUNTER — Encounter (HOSPITAL_COMMUNITY): Payer: Self-pay

## 2016-06-14 ENCOUNTER — Emergency Department (HOSPITAL_COMMUNITY): Payer: Self-pay

## 2016-06-14 DIAGNOSIS — F17200 Nicotine dependence, unspecified, uncomplicated: Secondary | ICD-10-CM | POA: Insufficient documentation

## 2016-06-14 DIAGNOSIS — R072 Precordial pain: Secondary | ICD-10-CM | POA: Insufficient documentation

## 2016-06-14 DIAGNOSIS — R079 Chest pain, unspecified: Secondary | ICD-10-CM

## 2016-06-14 DIAGNOSIS — Z79899 Other long term (current) drug therapy: Secondary | ICD-10-CM | POA: Insufficient documentation

## 2016-06-14 LAB — BASIC METABOLIC PANEL
ANION GAP: 5 (ref 5–15)
BUN: 7 mg/dL (ref 6–20)
CALCIUM: 9.8 mg/dL (ref 8.9–10.3)
CO2: 34 mmol/L — ABNORMAL HIGH (ref 22–32)
Chloride: 101 mmol/L (ref 101–111)
Creatinine, Ser: 0.82 mg/dL (ref 0.44–1.00)
Glucose, Bld: 77 mg/dL (ref 65–99)
Potassium: 3.3 mmol/L — ABNORMAL LOW (ref 3.5–5.1)
Sodium: 140 mmol/L (ref 135–145)

## 2016-06-14 LAB — CBC
HCT: 38.1 % (ref 36.0–46.0)
HEMOGLOBIN: 12.1 g/dL (ref 12.0–15.0)
MCH: 29.1 pg (ref 26.0–34.0)
MCHC: 31.8 g/dL (ref 30.0–36.0)
MCV: 91.6 fL (ref 78.0–100.0)
Platelets: 149 10*3/uL — ABNORMAL LOW (ref 150–400)
RBC: 4.16 MIL/uL (ref 3.87–5.11)
RDW: 15.4 % (ref 11.5–15.5)
WBC: 3.7 10*3/uL — ABNORMAL LOW (ref 4.0–10.5)

## 2016-06-14 LAB — I-STAT TROPONIN, ED: TROPONIN I, POC: 0 ng/mL (ref 0.00–0.08)

## 2016-06-14 LAB — ETHANOL: Alcohol, Ethyl (B): <5 mg/dL (ref <5)

## 2016-06-14 NOTE — ED Triage Notes (Signed)
Pt BIB EMS from shelter. Per EMS, pt reporting central CP that radiates down L arm. Given 324mg  ASA by EMS, but refused IV placement, nitro by EMS. Per EMS, pt repeatedly refused to answer questions. Pt reports a "small heart attack" while in [place], but did not elaborate for EMS. EMS reports asymptomatic presentation and strong smell of ETOH.

## 2016-06-14 NOTE — ED Notes (Signed)
Patient transported to X-ray 

## 2016-06-14 NOTE — ED Provider Notes (Signed)
[facility] Provider Note   CSN: [id number] Arrival date & time: 06/14/16  1851     History   Chief Complaint Chief Complaint  Patient presents with  . Chest Pain    HPI Sydney Gonzales is a 60 y.o. female.  The history is provided by the patient.  Chest Pain   This is a new problem. Episode onset: 3 hours ago. The problem occurs constantly. The problem has not changed since onset.The pain is associated with rest. The pain is present in the substernal region. The pain is mild. The quality of the pain is described as sharp. The pain radiates to the left shoulder. Duration of episode(s) is 3 hours. Pertinent negatives include no abdominal pain, no cough, no diaphoresis, no fever, no shortness of breath and no vomiting. Treatments tried: aspirin. The treatment provided no relief. Risk factors include smoking/tobacco exposure and substance abuse.    Past Medical History:  Diagnosis Date  . Abdominal adhesions   . Anxiety   . Chronic abdominal pain   . Chronic back pain   . Hepatitis C   . Migraine     Patient Active Problem List   Diagnosis Date Noted  . Bipolar affective disorder, depressed, severe (HCC) 03/17/2016  . Anxiety     Past Surgical History:  Procedure Laterality Date  . ABDOMINAL HYSTERECTOMY    . APPENDECTOMY    . CHOLECYSTECTOMY    . GALLBLADDER SURGERY      OB History    No data available       Home Medications    Prior to Admission medications   Medication Sig Start Date End Date Taking? Authorizing Provider  busPIRone (BUSPAR) 10 MG tablet Take 1 tablet (10 mg total) by mouth 3 (three) times daily. 03/21/16   Adonis Brook, NP  chlorthalidone (HYGROTON) 50 MG tablet Take 50 mg by mouth daily.    Historical Provider, MD  Cholecalciferol (VITAMIN D3) 2000 units capsule Take 2,000 Units by mouth daily.    Historical Provider, MD  dicyclomine (BENTYL) 20 MG tablet Take 1 tablet (20 mg total) by mouth 2 (two) times daily as needed for spasms.  06/03/16   Raeford Razor, MD  PARoxetine (PAXIL) 20 MG tablet Take 1 tablet (20 mg total) by mouth daily. 03/21/16   Adonis Brook, NP  potassium chloride SA (K-DUR,KLOR-CON) 20 MEQ tablet Take 1 tablet (20 mEq total) by mouth daily. 04/28/16   Rolan Bucco, MD  QUEtiapine (SEROQUEL) 50 MG tablet Take 50 mg by mouth at bedtime.    Historical Provider, MD  vitamin B-12 (CYANOCOBALAMIN) 100 MCG tablet Take 100 mcg by mouth daily.    Historical Provider, MD    Family History No family history on file.  Social History Social History  Substance Use Topics  . Smoking status: Current Every Day Smoker    Packs/day: 0.50  . Smokeless tobacco: Never Used  . Alcohol use No     Allergies   Shellfish allergy; Stadol [butorphanol tartrate]; Talwin [pentazocine]; Toradol [ketorolac tromethamine]; Ivp dye [iodinated diagnostic agents]; Morphine and related; and Nalbuphine   Review of Systems Review of Systems  Constitutional: Negative for diaphoresis and fever.  Respiratory: Negative for cough and shortness of breath.   Cardiovascular: Positive for chest pain.  Gastrointestinal: Negative for abdominal pain and vomiting.  All other systems reviewed and are negative.    Physical Exam Updated Vital Signs BP 156/85   Pulse 63   Temp 98.1 F (36.7 C)   Resp 18  SpO2 98%   Physical Exam  Constitutional: She is oriented to person, place, and time. She appears well-developed and well-nourished. No distress.  HENT:  Head: Normocephalic.  Eyes: Conjunctivae are normal.  Neck: Neck supple. No tracheal deviation present.  Cardiovascular: Normal rate, regular rhythm and normal heart sounds.  Exam reveals no friction rub.   No murmur heard. Pulmonary/Chest: Effort normal. No respiratory distress. She has no wheezes. She has no rales. She exhibits no tenderness.  Abdominal: Soft. She exhibits no distension.  Neurological: She is alert and oriented to person, place, and time.  Skin: Skin is warm  and dry.  Psychiatric: She has a normal mood and affect.  Vitals reviewed.    ED Treatments / Results  Labs (all labs ordered are listed, but only abnormal results are displayed) Labs Reviewed  BASIC METABOLIC PANEL - Abnormal; Notable for the following:       Result Value   Potassium 3.3 (*)    CO2 34 (*)    All other components within normal limits  CBC - Abnormal; Notable for the following:    WBC 3.7 (*)    Platelets 149 (*)    All other components within normal limits  ETHANOL  I-STAT TROPOININ, ED  I-STAT TROPOININ, ED    EKG  EKG Interpretation  Date/Time:  Thursday June 14 2016 18:58:20 EDT Ventricular Rate:  57 PR Interval:    QRS Duration: 102 QT Interval:  482 QTC Calculation: 470 R Axis:   54 Text Interpretation:  Sinus rhythm Borderline T wave abnormalities No significant change since last tracing Confirmed by Essam Lowdermilk MD, Ladon Heney [phone]) on 06/14/2016 7:35:49 PM       Radiology Dg Chest 2 View  Result Date: 06/14/2016 CLINICAL DATA:  Mid chest pain radiating down the left arm today. Smoker. EXAM: CHEST  2 VIEW COMPARISON:  04/22/2016 FINDINGS: Normal heart size and pulmonary vascularity. No focal airspace disease or consolidation in the lungs. No blunting of costophrenic angles. No pneumothorax. Mediastinal contours appear intact. Tortuous aorta. Degenerative changes in the spine and shoulders. Surgical clips in the right upper quadrant. IMPRESSION: No active cardiopulmonary disease. Electronically Signed   By: Burman NievesWilliam  Stevens M.D.   On: 06/14/2016 20:36    Procedures Procedures (including critical care time)  Medications Ordered in ED Medications - No data to display   Initial Impression / Assessment and Plan / ED Course  I have reviewed the triage vital signs and the nursing notes.  Pertinent labs & imaging results that were available during my care of the patient were reviewed by me and considered in my medical decision making (see chart for  details).  Clinical Course    60 year old female presents with 3 hours of ongoing left sided sharp chest pain that is highly atypical in nature. She has a smoking history and is overweight but otherwise no significant risk factors for ACS. Her EKG is unchanged from prior. Received 325 mg of aspirin with EMS and is refusing any other medications here. First troponin is negative. Wells is 0 and no significant risk for clinical suspicion for PE. Plan for delta troponin due to recent onset of pain to rule out ACS. Plan for primary care physician follow-up after this visit to further evaluate her discomfort. She is resting comfrotably throughout her emergency department course in no acute distress.   Final Clinical Impressions(s) / ED Diagnoses   Final diagnoses:  Chest pain, unspecified chest pain type    New Prescriptions New Prescriptions  No medications on file     Lyndal Pulley, MD 06/15/16 [phone]

## 2016-06-14 NOTE — ED Notes (Signed)
Phlebotomy at bedside at this time.

## 2016-06-15 ENCOUNTER — Emergency Department (HOSPITAL_COMMUNITY)
Admission: EM | Admit: 2016-06-15 | Discharge: 2016-06-17 | Disposition: A | Discharge status: Other Acute Inpatient Hospital | Payer: Federal, State, Local not specified - Other | Attending: Emergency Medicine | Admitting: Emergency Medicine

## 2016-06-15 ENCOUNTER — Encounter (HOSPITAL_COMMUNITY): Payer: Self-pay | Admitting: Emergency Medicine

## 2016-06-15 DIAGNOSIS — Z79899 Other long term (current) drug therapy: Secondary | ICD-10-CM | POA: Insufficient documentation

## 2016-06-15 DIAGNOSIS — F17200 Nicotine dependence, unspecified, uncomplicated: Secondary | ICD-10-CM | POA: Insufficient documentation

## 2016-06-15 DIAGNOSIS — R45851 Suicidal ideations: Secondary | ICD-10-CM

## 2016-06-15 DIAGNOSIS — F332 Major depressive disorder, recurrent severe without psychotic features: Secondary | ICD-10-CM | POA: Insufficient documentation

## 2016-06-15 DIAGNOSIS — F419 Anxiety disorder, unspecified: Secondary | ICD-10-CM | POA: Insufficient documentation

## 2016-06-15 LAB — CBC WITH DIFFERENTIAL/PLATELET
BASOS ABS: 0 10*3/uL (ref 0.0–0.1)
BASOS PCT: 0 %
EOS PCT: 4 %
Eosinophils Absolute: 0.1 10*3/uL (ref 0.0–0.7)
HCT: 37.4 % (ref 36.0–46.0)
Hemoglobin: 12.2 g/dL (ref 12.0–15.0)
LYMPHS PCT: 52 %
Lymphs Abs: 1.5 10*3/uL (ref 0.7–4.0)
MCH: 29.4 pg (ref 26.0–34.0)
MCHC: 32.6 g/dL (ref 30.0–36.0)
MCV: 90.1 fL (ref 78.0–100.0)
Monocytes Absolute: 0.3 10*3/uL (ref 0.1–1.0)
Monocytes Relative: 11 %
Neutro Abs: 0.9 10*3/uL — ABNORMAL LOW (ref 1.7–7.7)
Neutrophils Relative %: 32 %
PLATELETS: 159 10*3/uL (ref 150–400)
RBC: 4.15 MIL/uL (ref 3.87–5.11)
RDW: 15.7 % — ABNORMAL HIGH (ref 11.5–15.5)
WBC: 2.9 10*3/uL — AB (ref 4.0–10.5)

## 2016-06-15 LAB — COMPREHENSIVE METABOLIC PANEL
ALT: 19 U/L (ref 14–54)
AST: 35 U/L (ref 15–41)
Albumin: 4 g/dL (ref 3.5–5.0)
Alkaline Phosphatase: 71 U/L (ref 38–126)
Anion gap: 7 (ref 5–15)
BUN: 7 mg/dL (ref 6–20)
CHLORIDE: 99 mmol/L — AB (ref 101–111)
CO2: 33 mmol/L — ABNORMAL HIGH (ref 22–32)
CREATININE: 0.81 mg/dL (ref 0.44–1.00)
Calcium: 10 mg/dL (ref 8.9–10.3)
Glucose, Bld: 128 mg/dL — ABNORMAL HIGH (ref 65–99)
POTASSIUM: 2.9 mmol/L — AB (ref 3.5–5.1)
Sodium: 139 mmol/L (ref 135–145)
Total Bilirubin: 0.5 mg/dL (ref 0.3–1.2)
Total Protein: 8.2 g/dL — ABNORMAL HIGH (ref 6.5–8.1)

## 2016-06-15 LAB — I-STAT TROPONIN, ED: TROPONIN I, POC: 0 ng/mL (ref 0.00–0.08)

## 2016-06-15 LAB — ETHANOL

## 2016-06-15 MED ORDER — POTASSIUM CHLORIDE CRYS ER 20 MEQ PO TBCR
40.0000 meq | EXTENDED_RELEASE_TABLET | Freq: Once | ORAL | Status: AC
  Administered 2016-06-15: 40 meq via ORAL

## 2016-06-15 MED ORDER — POTASSIUM CHLORIDE CRYS ER 20 MEQ PO TBCR
40.0000 meq | EXTENDED_RELEASE_TABLET | Freq: Once | ORAL | Status: AC
  Administered 2016-06-15: 40 meq via ORAL
  Filled 2016-06-15: qty 2

## 2016-06-15 NOTE — ED Notes (Signed)
MD at bedside. 

## 2016-06-15 NOTE — ED Notes (Signed)
I attempted to collect labs and was unsuccessful patient would not let me stick her in the right [facility] or her hands
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
## 2016-06-15 NOTE — ED Notes (Signed)
Patient refused blood draw.

## 2016-06-15 NOTE — ED Notes (Signed)
Pt alert x4 v/s stable, no c/o pain. Sitter at the bedside, still expresses thoughts of harming self will continue to assess.
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
## 2016-06-15 NOTE — Progress Notes (Signed)
Patient noted to have been seen in the ED 11 times within the last six months. Patient listed as not having a pcp or insurance [place].   EDCM went to speak to patient at bedside, however patient was asleep.  Sitter at bedside.  EDCM placed the following information in patient's folder at nurses's station:  [facility], informed patient of services there and walk in times.  EDCM also provided patient with list of pcps who accept self pay patients, list of discount pharmacies and websites needymeds.org and GoodRX.com for medication assistance, phone number to inquire about the orange card, phone number to inquire about Mediciad, phone number to inquire about the Affordable Care Act, financial resources in the community such as local churches, salvation army, urban ministries, and dental assistance for uninsured patients.   No further EDCM needs at this time.  EDCM also provided patient contact information to [facility],  [facility],  [facility].  Patient's address is listed at [address], [place]

## 2016-06-15 NOTE — ED Notes (Signed)
Bed: [facility] Expected date:  Expected time:  Means of arrival:  Comments: 

## 2016-06-15 NOTE — ED Provider Notes (Signed)
[facility] Provider Note   CSN: [id number] Arrival date & time: 06/15/16  1656     History   Chief Complaint Chief Complaint  Patient presents with  . Suicidal    HPI Sydney Gonzales is a 60 y.o. female.  HPI Patient presents with increased depression and suicidal thoughts. States she placed a gun in her mouth earlier today. She stopped taking her mental health medications last week. She is still taking methadone daily for chronic pain. She denies any homicidal ideation. No alcohol or drug use. Denies any hallucinations.  Past Medical History:  Diagnosis Date  . Abdominal adhesions   . Anxiety   . Chronic abdominal pain   . Chronic back pain   . Hepatitis C   . Migraine     Patient Active Problem List   Diagnosis Date Noted  . MDD (major depressive disorder), recurrent severe, without psychosis (HCC) 06/16/2016  . Bipolar affective disorder, depressed, severe (HCC) 03/17/2016  . Anxiety     Past Surgical History:  Procedure Laterality Date  . ABDOMINAL HYSTERECTOMY    . APPENDECTOMY    . CHOLECYSTECTOMY    . GALLBLADDER SURGERY      OB History    No data available       Home Medications    Prior to Admission medications   Medication Sig Start Date End Date Taking? Authorizing Provider  busPIRone (BUSPAR) 10 MG tablet Take 1 tablet (10 mg total) by mouth 3 (three) times daily. 03/21/16  Yes Adonis BrookSheila Agustin, NP  Cholecalciferol (VITAMIN D3) 2000 units capsule Take 2,000 Units by mouth daily.   Yes Historical Provider, MD  methadone (DOLOPHINE) 10 MG/ML solution Take 70 mg by mouth daily.   Yes Historical Provider, MD  PARoxetine (PAXIL) 20 MG tablet Take 1 tablet (20 mg total) by mouth daily. 03/21/16  Yes Adonis BrookSheila Agustin, NP  potassium chloride SA (K-DUR,KLOR-CON) 20 MEQ tablet Take 1 tablet (20 mEq total) by mouth daily. 04/28/16  Yes Rolan BuccoMelanie Belfi, MD  QUEtiapine (SEROQUEL) 200 MG tablet Take 200 mg by mouth at bedtime.   Yes Historical Provider, MD  vitamin  B-12 (CYANOCOBALAMIN) 100 MCG tablet Take 100 mcg by mouth daily.   Yes Historical Provider, MD  dicyclomine (BENTYL) 20 MG tablet Take 1 tablet (20 mg total) by mouth 2 (two) times daily as needed for spasms. Patient not taking: Reported on 06/15/2016 06/03/16   Raeford RazorStephen Kohut, MD    Family History History reviewed. No pertinent family history.  Social History Social History  Substance Use Topics  . Smoking status: Current Every Day Smoker    Packs/day: 0.50  . Smokeless tobacco: Never Used  . Alcohol use No     Allergies   Shellfish allergy; Stadol [butorphanol tartrate]; Talwin [pentazocine]; Toradol [ketorolac tromethamine]; Ivp dye [iodinated diagnostic agents]; Morphine and related; and Nalbuphine   Review of Systems Review of Systems  Constitutional: Negative for chills and fever.  Respiratory: Negative for shortness of breath.   Cardiovascular: Negative for chest pain.  Gastrointestinal: Negative for abdominal pain, nausea and vomiting.  Neurological: Negative for dizziness, weakness and headaches.  Psychiatric/Behavioral: Positive for dysphoric mood and suicidal ideas. Negative for hallucinations.  All other systems reviewed and are negative.    Physical Exam Updated Vital Signs BP 125/65 (BP Location: Right Arm)   Pulse 60   Temp 98.6 F (37 C) (Oral)   Resp 14   Ht 5\' 6"  (1.676 m)   Wt 185 lb (83.9 kg)   SpO2  94%   BMI 29.86 kg/m   Physical Exam  Constitutional: She is oriented to person, place, and time. She appears well-developed and well-nourished. No distress.  HENT:  Head: Normocephalic and atraumatic.  Mouth/Throat: Oropharynx is clear and moist.  Eyes: EOM are normal. Pupils are equal, round, and reactive to light.  Neck: Normal range of motion. Neck supple.  Cardiovascular: Normal rate and regular rhythm.   Pulmonary/Chest: Effort normal and breath sounds normal.  Abdominal: Soft. Bowel sounds are normal. There is no tenderness. There is no  rebound and no guarding.  Musculoskeletal: Normal range of motion. She exhibits no edema or tenderness.  Neurological: She is alert and oriented to person, place, and time.  Skin: Skin is warm and dry. No rash noted. She is not diaphoretic. No erythema.  Psychiatric: Her behavior is normal.  Dysphoric mood. Endorses SI stating "I just don't want to be here".  Nursing note and vitals reviewed.    ED Treatments / Results  Labs (all labs ordered are listed, but only abnormal results are displayed) Labs Reviewed  COMPREHENSIVE METABOLIC PANEL - Abnormal; Notable for the following:       Result Value   Potassium 2.9 (*)    Chloride 99 (*)    CO2 33 (*)    Glucose, Bld 128 (*)    Total Protein 8.2 (*)    All other components within normal limits  CBC WITH DIFFERENTIAL/PLATELET - Abnormal; Notable for the following:    WBC 2.9 (*)    RDW 15.7 (*)    Neutro Abs 0.9 (*)    All other components within normal limits  URINE RAPID DRUG SCREEN, HOSP PERFORMED - Abnormal; Notable for the following:    Cocaine POSITIVE (*)    All other components within normal limits  ETHANOL  I-STAT CHEM 8, ED    EKG  EKG Interpretation None       Radiology No results found.  Procedures Procedures (including critical care time)  Medications Ordered in ED Medications  LORazepam (ATIVAN) tablet 0-4 mg (0 mg Oral Not Given 06/16/16 2325)    Followed by  LORazepam (ATIVAN) tablet 0-4 mg (not administered)  acetaminophen (TYLENOL) tablet 650 mg (not administered)  ondansetron (ZOFRAN) tablet 4 mg (not administered)  busPIRone (BUSPAR) tablet 10 mg (10 mg Oral Given 06/16/16 2157)  PARoxetine (PAXIL) tablet 20 mg (20 mg Oral Given 06/16/16 1034)  potassium chloride SA (K-DUR,KLOR-CON) CR tablet 20 mEq (20 mEq Oral Given 06/16/16 1034)  QUEtiapine (SEROQUEL) tablet 200 mg (200 mg Oral Given 06/16/16 2157)  vitamin B-12 (CYANOCOBALAMIN) tablet 100 mcg (100 mcg Oral Given 06/16/16 1034)  cholecalciferol  (VITAMIN D) tablet 2,000 Units (2,000 Units Oral Given 06/16/16 1034)  potassium chloride SA (K-DUR,KLOR-CON) CR tablet 40 mEq (40 mEq Oral Given 06/15/16 2141)  potassium chloride SA (K-DUR,KLOR-CON) CR tablet 40 mEq (40 mEq Oral Given 06/15/16 2300)     Initial Impression / Assessment and Plan / ED Course  I have reviewed the triage vital signs and the nursing notes.  Pertinent labs & imaging results that were available during my care of the patient were reviewed by me and considered in my medical decision making (see chart for details).  Clinical Course   Will replace potassium and will need i-STAT before clearance for TTS evaluation. Signed out to oncoming emergency physician.  Final Clinical Impressions(s) / ED Diagnoses   Final diagnoses:  None    New Prescriptions New Prescriptions   No medications on file  Loren Racer, MD 06/17/16 [phone]

## 2016-06-16 ENCOUNTER — Encounter (HOSPITAL_COMMUNITY): Payer: Self-pay | Admitting: Registered Nurse

## 2016-06-16 DIAGNOSIS — F332 Major depressive disorder, recurrent severe without psychotic features: Secondary | ICD-10-CM | POA: Diagnosis not present

## 2016-06-16 LAB — RAPID URINE DRUG SCREEN, HOSP PERFORMED
AMPHETAMINES: NOT DETECTED
BARBITURATES: NOT DETECTED
BENZODIAZEPINES: NOT DETECTED
Cocaine: POSITIVE — AB
Opiates: NOT DETECTED
TETRAHYDROCANNABINOL: NOT DETECTED

## 2016-06-16 LAB — I-STAT CHEM 8, ED
BUN: 10 mg/dL (ref 6–20)
CALCIUM ION: 1.23 mmol/L (ref 1.15–1.40)
Chloride: 102 mmol/L (ref 101–111)
Creatinine, Ser: 0.7 mg/dL (ref 0.44–1.00)
Glucose, Bld: 84 mg/dL (ref 65–99)
HEMATOCRIT: 37 % (ref 36.0–46.0)
HEMOGLOBIN: 12.6 g/dL (ref 12.0–15.0)
Potassium: 4.2 mmol/L (ref 3.5–5.1)
SODIUM: 141 mmol/L (ref 135–145)
TCO2: 28 mmol/L (ref 0–100)

## 2016-06-16 MED ORDER — PAROXETINE HCL 20 MG PO TABS
20.0000 mg | ORAL_TABLET | Freq: Every day | ORAL | Status: DC
  Administered 2016-06-16 – 2016-06-17 (×2): 20 mg via ORAL
  Filled 2016-06-16 (×2): qty 1

## 2016-06-16 MED ORDER — ONDANSETRON HCL 4 MG PO TABS: 4.0000 mg | ORAL_TABLET | Freq: Three times a day (TID) | ORAL | Status: DC | PRN

## 2016-06-16 MED ORDER — LORAZEPAM 1 MG PO TABS: 0.0000 mg | ORAL_TABLET | Freq: Two times a day (BID) | ORAL | Status: DC

## 2016-06-16 MED ORDER — POTASSIUM CHLORIDE CRYS ER 20 MEQ PO TBCR
20.0000 meq | EXTENDED_RELEASE_TABLET | Freq: Every day | ORAL | Status: DC
  Administered 2016-06-16 – 2016-06-17 (×2): 20 meq via ORAL
  Filled 2016-06-16 (×2): qty 1

## 2016-06-16 MED ORDER — VITAMIN B-12 100 MCG PO TABS
100.0000 ug | ORAL_TABLET | Freq: Every day | ORAL | Status: DC
  Administered 2016-06-16 – 2016-06-17 (×2): 100 ug via ORAL
  Filled 2016-06-16 (×2): qty 1

## 2016-06-16 MED ORDER — VITAMIN D 1000 UNITS PO TABS
2000.0000 [IU] | ORAL_TABLET | Freq: Every day | ORAL | Status: DC
  Administered 2016-06-16 – 2016-06-17 (×2): 2000 [IU] via ORAL
  Filled 2016-06-16 (×2): qty 2

## 2016-06-16 MED ORDER — LORAZEPAM 1 MG PO TABS: 0.0000 mg | ORAL_TABLET | Freq: Four times a day (QID) | ORAL | Status: DC

## 2016-06-16 MED ORDER — QUETIAPINE FUMARATE 100 MG PO TABS
200.0000 mg | ORAL_TABLET | Freq: Every day | ORAL | Status: DC
  Administered 2016-06-16 (×2): 200 mg via ORAL
  Filled 2016-06-16 (×2): qty 2

## 2016-06-16 MED ORDER — BUSPIRONE HCL 10 MG PO TABS
10.0000 mg | ORAL_TABLET | Freq: Three times a day (TID) | ORAL | Status: DC
  Administered 2016-06-16 – 2016-06-17 (×4): 10 mg via ORAL
  Filled 2016-06-16 (×4): qty 1

## 2016-06-16 MED ORDER — ACETAMINOPHEN 325 MG PO TABS
650.0000 mg | ORAL_TABLET | ORAL | Status: DC | PRN
  Administered 2016-06-17: 650 mg via ORAL
  Filled 2016-06-16: qty 2

## 2016-06-16 NOTE — ED Notes (Signed)
Report gotten on pt. Pt in bed resting comfortably with eyes closed. resp even and unlabored. Sitter remains at bedside. VWilliams,rn.
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
## 2016-06-17 ENCOUNTER — Inpatient Hospital Stay (HOSPITAL_COMMUNITY)
Admission: AD | Admit: 2016-06-17 | Discharge: 2016-06-26 | DRG: 885 | Disposition: A | Discharge status: Home / Self Care | Payer: Federal, State, Local not specified - Other | Source: Intra-hospital | Attending: Psychiatry | Admitting: Psychiatry

## 2016-06-17 ENCOUNTER — Encounter (HOSPITAL_COMMUNITY): Payer: Self-pay | Admitting: Behavioral Health

## 2016-06-17 DIAGNOSIS — Z818 Family history of other mental and behavioral disorders: Secondary | ICD-10-CM

## 2016-06-17 DIAGNOSIS — R45851 Suicidal ideations: Secondary | ICD-10-CM | POA: Diagnosis not present

## 2016-06-17 DIAGNOSIS — Z79899 Other long term (current) drug therapy: Secondary | ICD-10-CM | POA: Diagnosis not present

## 2016-06-17 DIAGNOSIS — F17200 Nicotine dependence, unspecified, uncomplicated: Secondary | ICD-10-CM | POA: Diagnosis present

## 2016-06-17 DIAGNOSIS — F315 Bipolar disorder, current episode depressed, severe, with psychotic features: Secondary | ICD-10-CM | POA: Diagnosis present

## 2016-06-17 DIAGNOSIS — M79602 Pain in left arm: Secondary | ICD-10-CM | POA: Diagnosis present

## 2016-06-17 DIAGNOSIS — B1920 Unspecified viral hepatitis C without hepatic coma: Secondary | ICD-10-CM | POA: Diagnosis present

## 2016-06-17 DIAGNOSIS — F1410 Cocaine abuse, uncomplicated: Secondary | ICD-10-CM | POA: Clinically undetermined

## 2016-06-17 DIAGNOSIS — F332 Major depressive disorder, recurrent severe without psychotic features: Secondary | ICD-10-CM | POA: Diagnosis present

## 2016-06-17 DIAGNOSIS — F4310 Post-traumatic stress disorder, unspecified: Secondary | ICD-10-CM | POA: Diagnosis not present

## 2016-06-17 DIAGNOSIS — F1190 Opioid use, unspecified, uncomplicated: Secondary | ICD-10-CM | POA: Diagnosis not present

## 2016-06-17 DIAGNOSIS — F1110 Opioid abuse, uncomplicated: Secondary | ICD-10-CM | POA: Diagnosis present

## 2016-06-17 DIAGNOSIS — G4700 Insomnia, unspecified: Secondary | ICD-10-CM | POA: Diagnosis present

## 2016-06-17 MED ORDER — METHADONE HCL 5 MG PO TABS
67.5000 mg | ORAL_TABLET | Freq: Every day | ORAL | Status: DC
  Administered 2016-06-18 – 2016-06-26 (×9): 67.5 mg via ORAL
  Filled 2016-06-17: qty 2
  Filled 2016-06-17: qty 6
  Filled 2016-06-17: qty 2
  Filled 2016-06-17: qty 6
  Filled 2016-06-17 (×7): qty 2

## 2016-06-17 MED ORDER — BUSPIRONE HCL 10 MG PO TABS
10.0000 mg | ORAL_TABLET | Freq: Three times a day (TID) | ORAL | Status: DC
  Administered 2016-06-17 – 2016-06-26 (×26): 10 mg via ORAL
  Filled 2016-06-17 (×6): qty 1
  Filled 2016-06-17: qty 2
  Filled 2016-06-17 (×2): qty 1
  Filled 2016-06-17: qty 2
  Filled 2016-06-17 (×5): qty 1
  Filled 2016-06-17: qty 2
  Filled 2016-06-17 (×9): qty 1
  Filled 2016-06-17: qty 2
  Filled 2016-06-17 (×4): qty 1
  Filled 2016-06-17: qty 2
  Filled 2016-06-17 (×6): qty 1

## 2016-06-17 MED ORDER — METHADONE HCL 5 MG PO TABS
67.0000 mg | ORAL_TABLET | Freq: Every day | ORAL | Status: DC
  Filled 2016-06-17: qty 2

## 2016-06-17 MED ORDER — POTASSIUM CHLORIDE CRYS ER 20 MEQ PO TBCR
20.0000 meq | EXTENDED_RELEASE_TABLET | Freq: Every day | ORAL | Status: DC
  Administered 2016-06-18 – 2016-06-26 (×9): 20 meq via ORAL
  Filled 2016-06-17 (×12): qty 1

## 2016-06-17 MED ORDER — QUETIAPINE FUMARATE 200 MG PO TABS
200.0000 mg | ORAL_TABLET | Freq: Every day | ORAL | Status: DC
  Administered 2016-06-17 – 2016-06-18 (×2): 200 mg via ORAL
  Filled 2016-06-17 (×4): qty 1

## 2016-06-17 MED ORDER — ENSURE ENLIVE PO LIQD
237.0000 mL | Freq: Two times a day (BID) | ORAL | Status: DC
  Administered 2016-06-17 – 2016-06-26 (×17): 237 mL via ORAL
  Filled 2016-06-17: qty 237

## 2016-06-17 MED ORDER — ALUM & MAG HYDROXIDE-SIMETH 200-200-20 MG/5ML PO SUSP
30.0000 mL | ORAL | Status: DC | PRN
  Administered 2016-06-22: 30 mL via ORAL
  Filled 2016-06-17: qty 30

## 2016-06-17 MED ORDER — PAROXETINE HCL 20 MG PO TABS
20.0000 mg | ORAL_TABLET | Freq: Every day | ORAL | Status: DC
  Administered 2016-06-18 – 2016-06-26 (×9): 20 mg via ORAL
  Filled 2016-06-17 (×12): qty 1

## 2016-06-17 MED ORDER — VITAMIN D3 25 MCG (1000 UNIT) PO TABS
2000.0000 [IU] | ORAL_TABLET | Freq: Every day | ORAL | Status: DC
  Administered 2016-06-18 – 2016-06-26 (×9): 2000 [IU] via ORAL
  Filled 2016-06-17 (×12): qty 2

## 2016-06-17 MED ORDER — VITAMIN D3 25 MCG (1000 UNIT) PO TABS
2000.0000 [IU] | ORAL_TABLET | Freq: Every day | ORAL | Status: DC
  Filled 2016-06-17 (×2): qty 2

## 2016-06-17 MED ORDER — METHADONE HCL 10 MG/ML PO CONC
67.5000 mg | Freq: Every day | ORAL | Status: DC
  Administered 2016-06-17: 67.5 mg via ORAL
  Filled 2016-06-17: qty 6.8

## 2016-06-17 MED ORDER — MAGNESIUM HYDROXIDE 400 MG/5ML PO SUSP: 30.0000 mL | Freq: Every day | ORAL | Status: DC | PRN

## 2016-06-17 NOTE — ED Notes (Signed)
[facility] advised that room previously assigned to pt was a female bed and that a female bed would not be available until morning. Pt currently resting with eyes closed and equal chest rise and fall no acute distress. Sitter at bedside.

## 2016-06-17 NOTE — ED Provider Notes (Signed)
Potassium is good, normal.  Good for [facility].     Cy BlamerApril Silva Aamodt, MD 06/17/16 [phone]

## 2016-06-17 NOTE — ED Notes (Addendum)
Dr.Yelerton notified of Istat results and that medical clearance authorization to be completed [facility].
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
## 2016-06-17 NOTE — ED Notes (Signed)
Patient requests Methadone 70 mg and states she [place] this daily from [facility] [place].  We are unable to confirm that dose with [facility].  They close at 9:15 on Sundays.  We left messages at the emergency number, [phone], asking for a return call.

## 2016-06-17 NOTE — Tx Team (Signed)
Initial Treatment Plan 06/17/2016 3:30 PM Beryle Lathearol Mottola ZOX:[id number]    PATIENT STRESSORS: Financial difficulties Medication change or noncompliance Substance abuse   PATIENT STRENGTHS: Ability for insight Capable of independent living Motivation for treatment/growth   PATIENT IDENTIFIED PROBLEMS: "self esteem"  "a place to live"  "depression"                  DISCHARGE CRITERIA:  Ability to meet basic life and health needs Adequate post-discharge living arrangements Improved stabilization in mood, thinking, and/or behavior  PRELIMINARY DISCHARGE PLAN: Attend aftercare/continuing care group Attend PHP/IOP  PATIENT/FAMILY INVOLVEMENT: This treatment plan has been presented to and reviewed with the patient, Beryle LatheCarol Rexroad, and/or family member.  The patient and family have been given the opportunity to ask questions and make suggestions.  Layla BarterWhite, Covey Baller L, RN 06/17/2016, 3:30 PM

## 2016-06-17 NOTE — ED Notes (Signed)
[facility] called for transport. 

## 2016-06-17 NOTE — BH Assessment (Signed)
[facility] Assessment Progress Note This Clinical research associatewriter spoke with Alcohol and Drug Services Nurse Supervisor Jorge MandrilShantina Thomas RN [phone], [phone] who verified patient was currently receiving methadone from their facility. Nursing stated patient was receiving 67 mg daily.

## 2016-06-17 NOTE — ED Notes (Signed)
Awaiting medical clearance for [facility].

## 2016-06-17 NOTE — Progress Notes (Signed)
Writer notified Shuvon Rankin, NP., in [place] in regards to pt order for methadone. Provider stated that the order was placed prior to pt discharging from [facility]. [place], Clinical research associatewriter could not locate order under signed and held orders. Verbal order given by Denice BorsShuvon, for writer to order Methadone 67.5 mg daily.

## 2016-06-18 DIAGNOSIS — F4310 Post-traumatic stress disorder, unspecified: Secondary | ICD-10-CM | POA: Diagnosis present

## 2016-06-18 DIAGNOSIS — F315 Bipolar disorder, current episode depressed, severe, with psychotic features: Secondary | ICD-10-CM | POA: Diagnosis present

## 2016-06-18 DIAGNOSIS — F1410 Cocaine abuse, uncomplicated: Secondary | ICD-10-CM | POA: Clinically undetermined

## 2016-06-18 DIAGNOSIS — F1110 Opioid abuse, uncomplicated: Secondary | ICD-10-CM | POA: Diagnosis present

## 2016-06-18 DIAGNOSIS — R45851 Suicidal ideations: Secondary | ICD-10-CM

## 2016-06-18 MED ORDER — HYDROXYZINE HCL 50 MG PO TABS
50.0000 mg | ORAL_TABLET | Freq: Every evening | ORAL | Status: DC | PRN
  Administered 2016-06-18 – 2016-06-25 (×12): 50 mg via ORAL
  Filled 2016-06-18 (×23): qty 1

## 2016-06-18 MED ORDER — GABAPENTIN 100 MG PO CAPS
100.0000 mg | ORAL_CAPSULE | Freq: Three times a day (TID) | ORAL | Status: DC
  Administered 2016-06-18 – 2016-06-19 (×3): 100 mg via ORAL
  Filled 2016-06-18 (×6): qty 1

## 2016-06-18 MED ORDER — GUAIFENESIN ER 600 MG PO TB12
1200.0000 mg | ORAL_TABLET | Freq: Two times a day (BID) | ORAL | Status: DC | PRN
  Administered 2016-06-18 – 2016-06-24 (×4): 1200 mg via ORAL
  Filled 2016-06-18 (×4): qty 2

## 2016-06-18 MED ORDER — BENZONATATE 100 MG PO CAPS
200.0000 mg | ORAL_CAPSULE | Freq: Three times a day (TID) | ORAL | Status: DC | PRN
  Administered 2016-06-18 – 2016-06-24 (×6): 200 mg via ORAL
  Filled 2016-06-18 (×6): qty 2

## 2016-06-18 NOTE — Progress Notes (Signed)
Recreation Therapy Notes  Date: 06/18/16 Time: 0930 Location: 300 Hall Group Room  Group Topic: Stress Management  Goal Area(s) Addresses:  Patient will verbalize importance of using healthy stress management.  Patient will identify positive emotions associated with healthy stress management.   Intervention: Stress Management  Activity :  Guided Imagery.  LRT introduced the stress management technique of guided imagery to the patients.  LRT read a script so patients could participate in the activity.  Patients were to follow along as LRT read script to participate.  Education:  Stress Management, Discharge Planning.   Education Outcome: Needs additional education  Clinical Observations/Feedback: Pt did not attend group.   Rynell Ciotti, LRT/CTRS         Najmo Pardue A 06/18/2016 12:24 PM 

## 2016-06-18 NOTE — Progress Notes (Signed)
Patient did attend the evening speaker AA meeting.  
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
## 2016-06-18 NOTE — Progress Notes (Signed)
D: Patient denies SI/HI and A/V hallucinations  A: Monitored q 15 minutes; patient encouraged to attend groups; patient educated about medications; patient given medications per physician orders; patient encouraged to express feelings and/or concerns  R: Patient is flat and blunted; patient's interaction with staff and peers is minimal; patient was able to set goal to talk with staff 1:1 when having feelings of SI; patient is taking medications as prescribed and tolerating medications; patient is attending all groups
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
## 2016-06-18 NOTE — Progress Notes (Signed)
NUTRITION ASSESSMENT  Pt identified as at risk on the Malnutrition Screen Tool  INTERVENTION: 1. Supplements: Continue Ensure Enlive po BID, each supplement provides 350 kcal and 20 grams of protein  NUTRITION DIAGNOSIS: Unintentional weight loss related to sub-optimal intake as evidenced by pt report.   Goal: Pt to meet >/= 90% of their estimated nutrition needs.  Monitor:  PO intake  Assessment:  Pt admitted with depression and homelessness. Per weight history, pt has lost 19 lb since 6/3 (10% wt loss x 3 months, significant for time frame). Pt has been ordered Ensure supplements ,will continue order.   Height: Ht Readings from Last 1 Encounters:  06/17/16 5\' 8"  (1.727 m)    Weight: Wt Readings from Last 1 Encounters:  06/17/16 181 lb (82.1 kg)    Weight Hx: Wt Readings from Last 10 Encounters:  06/17/16 181 lb (82.1 kg)  06/16/16 185 lb (83.9 kg)  06/03/16 190 lb (86.2 kg)  05/09/16 190 lb (86.2 kg)  04/22/16 193 lb (87.5 kg)  03/24/16 180 lb (81.6 kg)  03/17/16 200 lb (90.7 kg)  03/16/16 200 lb (90.7 kg)    BMI:  Body mass index is 27.52 kg/m. Pt meets criteria for overweight based on current BMI.  Estimated Nutritional Needs: Kcal: 25-30 kcal/kg Protein: > 1 gram protein/kg Fluid: 1 ml/kcal  Diet Order: Diet regular Room service appropriate? Yes; Fluid consistency: Thin Pt is also offered choice of unit snacks mid-morning and mid-afternoon.  Pt is eating as desired.   Lab results and medications reviewed.   Sydney FrancoLindsey Antionette Luster, MS, RD, LDN Pager: [phone] After Hours Pager: [phone]
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
## 2016-06-18 NOTE — Progress Notes (Signed)
D. Pt had been up and visible in milieu this evening, did attend and participate in evening group activity. Sydney Gonzales has appeared depressed this evening and did endorse feelings of depression. Sydney Gonzales also spoke about how she has been having some difficulties with sleep. Sydney Gonzales did request and receive bedtime medication without incident. A. Support and encouragement provided. R. Safety maintained, will continue to monitor.

## 2016-06-19 MED ORDER — LAMOTRIGINE 25 MG PO TABS
25.0000 mg | ORAL_TABLET | Freq: Every day | ORAL | Status: DC
Start: 2016-06-19 — End: 2016-06-26
  Administered 2016-06-19 – 2016-06-26 (×8): 25 mg via ORAL
  Filled 2016-06-19 (×12): qty 1

## 2016-06-19 MED ORDER — QUETIAPINE FUMARATE 400 MG PO TABS
400.0000 mg | ORAL_TABLET | Freq: Every day | ORAL | Status: DC
Start: 2016-06-19 — End: 2016-06-26
  Administered 2016-06-19 – 2016-06-25 (×7): 400 mg via ORAL
  Filled 2016-06-19 (×9): qty 1

## 2016-06-19 MED ORDER — NICOTINE 21 MG/24HR TD PT24
21.0000 mg | MEDICATED_PATCH | Freq: Every day | TRANSDERMAL | Status: DC
  Administered 2016-06-19 – 2016-06-25 (×7): 21 mg via TRANSDERMAL
  Filled 2016-06-19 (×11): qty 1

## 2016-06-19 MED ORDER — GABAPENTIN 100 MG PO CAPS
200.0000 mg | ORAL_CAPSULE | ORAL | Status: DC
  Administered 2016-06-19 – 2016-06-21 (×6): 200 mg via ORAL
  Filled 2016-06-19 (×9): qty 2

## 2016-06-19 MED ORDER — IPRATROPIUM-ALBUTEROL 0.5-2.5 (3) MG/3ML IN SOLN
3.0000 mL | Freq: Four times a day (QID) | RESPIRATORY_TRACT | Status: DC
  Administered 2016-06-19 – 2016-06-23 (×12): 3 mL via RESPIRATORY_TRACT
  Filled 2016-06-19 (×26): qty 3

## 2016-06-19 NOTE — Progress Notes (Signed)
Adult Psychoeducational Group Note  Date:  06/19/2016 Time:  9:24 PM  Group Topic/Focus:  Wrap-Up Group:   The focus of this group is to help patients review their daily goal of treatment and discuss progress on daily workbooks.   Participation Level:  Active  Participation Quality:  Appropriate  Affect:  Appropriate  Cognitive:  Alert  Insight: Appropriate  Engagement in Group:  Engaged  Modes of Intervention:  Discussion  Additional Comments:  Patient states, "I did not have a good day". Patient also states, "my goal for today was to try not to think about suicide, but I did".  Jarin Cornfield L Toryn Mcclinton 06/19/2016, 9:24 PM

## 2016-06-19 NOTE — Progress Notes (Signed)
D. Pt had been up and visible in milieu this evening, seen interacting appropriate with peers. Sydney Gonzales has endorsed on-going depression but able to contract for safety in the milieu. Sydney Gonzales did complain of non-productive cough and did request and receive some medications to help along with regular bedtime medications. A. Support and encouragement provided. R. Safety maintained, will continue to monitor.

## 2016-06-19 NOTE — Tx Team (Signed)
Interdisciplinary Treatment and Diagnostic Plan Update  06/19/2016 Time of Session: 9:30am Sydney Gonzales MRN: [id number]  Principal Diagnosis: Bipolar disorder, curr episode depressed, severe, w/psychotic features ([place])  Secondary Diagnoses: Principal Problem:   Bipolar disorder, curr episode depressed, severe, w/psychotic features ([place]) Active Problems:   PTSD (post-traumatic stress disorder)   Opioid use disorder, mild, on maintenance therapy   Cocaine use disorder, mild, abuse   Current Medications:  Current Facility-Administered Medications  Medication Dose Route Frequency Provider Last Rate Last Dose  . alum & mag hydroxide-simeth (MAALOX/MYLANTA) 200-200-20 MG/5ML suspension 30 mL  30 mL Oral Q4H PRN Shuvon B Rankin, NP      . benzonatate (TESSALON) capsule 200 mg  200 mg Oral TID PRN Laverle Hobby, PA-C   200 mg at 06/19/16 0314  . busPIRone (BUSPAR) tablet 10 mg  10 mg Oral TID Shuvon B Rankin, NP   10 mg at 06/19/16 0752  . cholecalciferol (VITAMIN D) tablet 2,000 Units  2,000 Units Oral Daily Jenne Campus, MD   2,000 Units at 06/19/16 0747  . feeding supplement (ENSURE ENLIVE) (ENSURE ENLIVE) liquid 237 mL  237 mL Oral BID BM Shuvon B Rankin, NP   237 mL at 06/18/16 0821  . gabapentin (NEURONTIN) capsule 100 mg  100 mg Oral TID Ursula Alert, MD   100 mg at 06/19/16 0748  . guaiFENesin (MUCINEX) 12 hr tablet 1,200 mg  1,200 mg Oral BID PRN Laverle Hobby, PA-C   1,200 mg at 06/18/16 2146  . hydrOXYzine (ATARAX/VISTARIL) tablet 50 mg  50 mg Oral QHS,MR X 1 Laverle Hobby, PA-C   50 mg at 06/18/16 2146  . magnesium hydroxide (MILK OF MAGNESIA) suspension 30 mL  30 mL Oral Daily PRN Shuvon B Rankin, NP      . methadone (DOLOPHINE) tablet 67.5 mg  67.5 mg Oral Daily Shuvon B Rankin, NP   67.5 mg at 06/19/16 0756  . nicotine (NICODERM CQ - dosed in mg/24 hours) patch 21 mg  21 mg Transdermal Daily Jenne Campus, MD   21 mg at 06/19/16 0748  . PARoxetine (PAXIL) tablet 20 mg   20 mg Oral Daily Shuvon B Rankin, NP   20 mg at 06/19/16 0748  . potassium chloride SA (K-DUR,KLOR-CON) CR tablet 20 mEq  20 mEq Oral Daily Shuvon B Rankin, NP   20 mEq at 06/19/16 0747  . QUEtiapine (SEROQUEL) tablet 200 mg  200 mg Oral QHS Shuvon B Rankin, NP   200 mg at 06/18/16 2124   PTA Medications: Prescriptions Prior to Admission  Medication Sig Dispense Refill Last Dose  . busPIRone (BUSPAR) 10 MG tablet Take 1 tablet (10 mg total) by mouth 3 (three) times daily. 90 tablet 0 a week  . Cholecalciferol (VITAMIN D3) 2000 units capsule Take 2,000 Units by mouth daily.   06/14/2016 at Unknown time  . dicyclomine (BENTYL) 20 MG tablet Take 1 tablet (20 mg total) by mouth 2 (two) times daily as needed for spasms. (Patient not taking: Reported on 06/15/2016) 10 tablet 0 Not Taking at Unknown time  . methadone (DOLOPHINE) 10 MG/ML solution Take 67.5 mg by mouth daily.    06/15/2016 at Unknown time  . PARoxetine (PAXIL) 20 MG tablet Take 1 tablet (20 mg total) by mouth daily. 30 tablet 0 a week  . potassium chloride SA (K-DUR,KLOR-CON) 20 MEQ tablet Take 1 tablet (20 mEq total) by mouth daily. 3 tablet 0 a week  . QUEtiapine (SEROQUEL) 200 MG tablet  Take 200 mg by mouth at bedtime.   06/14/2016 at Unknown time  . vitamin B-12 (CYANOCOBALAMIN) 100 MCG tablet Take 100 mcg by mouth daily.   06/14/2016 at Unknown time    Treatment Modalities: Medication Management, Group therapy, Case management,  1 to 1 session with clinician, Psychoeducation, Recreational therapy.   Physician Treatment Plan for Primary Diagnosis: Bipolar disorder, curr episode depressed, severe, w/psychotic features ([place]) Long Term Goal(s): Improvement in symptoms so as ready for discharge   Short Term Goals: Ability to identify changes in lifestyle to reduce recurrence of condition will improve, Ability to identify and develop effective coping behaviors will improve and Compliance with prescribed medications will improve  Medication  Management: Evaluate patient's response, side effects, and tolerance of medication regimen.  Therapeutic Interventions: 1 to 1 sessions, Unit Group sessions and Medication administration.  Evaluation of Outcomes: Not Met  Physician Treatment Plan for Secondary Diagnosis: Principal Problem:   Bipolar disorder, curr episode depressed, severe, w/psychotic features ([place]) Active Problems:   PTSD (post-traumatic stress disorder)   Opioid use disorder, mild, on maintenance therapy   Cocaine use disorder, mild, abuse  Long Term Goal(s): Improvement in symptoms so as ready for discharge  Short Term Goals: Ability to identify changes in lifestyle to reduce recurrence of condition will improve, Ability to identify and develop effective coping behaviors will improve and Compliance with prescribed medications will improve  Medication Management: Evaluate patient's response, side effects, and tolerance of medication regimen.  Therapeutic Interventions: 1 to 1 sessions, Unit Group sessions and Medication administration.  Evaluation of Outcomes: Not Met   RN Treatment Plan for Primary Diagnosis: Bipolar disorder, curr episode depressed, severe, w/psychotic features ([place]) Long Term Goal(s): Knowledge of disease and therapeutic regimen to maintain health will improve  Short Term Goals: Ability to participate in decision making will improve, Ability to verbalize feelings will improve and Ability to identify and develop effective coping behaviors will improve  Medication Management: RN will administer medications as ordered by provider, will assess and evaluate patient's response and provide education to patient for prescribed medication. RN will report any adverse and/or side effects to prescribing provider.  Therapeutic Interventions: 1 on 1 counseling sessions, Psychoeducation, Medication administration, Evaluate responses to treatment, Monitor vital signs and CBGs as ordered, Perform/monitor CIWA,  COWS, AIMS and Fall Risk screenings as ordered, Perform wound care treatments as ordered.  Evaluation of Outcomes: Not Met   LCSW Treatment Plan for Primary Diagnosis: Bipolar disorder, curr episode depressed, severe, w/psychotic features ([place]) Long Term Goal(s): Safe transition to appropriate next level of care at discharge, Engage patient in therapeutic group addressing interpersonal concerns.  Short Term Goals: Engage patient in aftercare planning with referrals and resources, Increase social support and Increase skills for wellness and recovery  Therapeutic Interventions: Assess for all discharge needs, 1 to 1 time with Social worker, Explore available resources and support systems, Assess for adequacy in community support network, Educate family and significant other(s) on suicide prevention, Complete Psychosocial Assessment, Interpersonal group therapy.  Evaluation of Outcomes: Not Met   Progress in Treatment :  Attending groups: Continuing to assess  Participating in groups: Continuing to assess  Taking medication as prescribed: Yes, MD continuing to assess for appropriate medication regimen  Toleration medication: Yes  Family/Significant other contact made: Treatment team assessing for appropriate contacts  Patient understands diagnosis: Yes  Discussing patient identified problems/goals with staff: Yes  Medical problems stabilized or resolved: Yes  Denies suicidal/homicidal ideation: Treatment team continuing to asses  Issues/concerns per patient self-inventory: None reported  Other: N/A  New problem(s) identified: None reported at this time    New Short Term/Long Term Goal(s): None at this time    Discharge Plan or Barriers: Treatment team continuing to assess.    Reason for Continuation of Hospitalization: Anxiety Depression Medication stabilization Suicidal Ideations Withdrawal symptoms  Estimated Length of Stay: 3-5 days     Attendees:  Patient:                        Physician: Dr. Parke Poisson & Dr. Shea Evans , MD  06/18/2016   9:30am  Nursing: Raynelle Jan, RN  06/18/2016 9:30am  RN Care Manager:   Social Workers: Erasmo Downer Sunaina Ferrando, LCSW, Peri Maris, [place] 06/18/2016 9:30am  Nurse Pratictioners: Samuel Jester, NP 06/18/16   Scribe for Treatment Team: Tilden Fossa, [place] Worker [facility] [phone]

## 2016-06-19 NOTE — [facility] Group Notes (Signed)
[facility] Group Notes:  (Nursing/MHT/Case Management/Adjunct)  Date:  06/19/2016  Time:  9:04 AM  Type of Therapy:  Psychoeducational Skills  Participation Level:  None  Participation Quality:  Drowsy  Affect:  Flat  Cognitive:  Lacking  Insight:  Limited  Engagement in Group:  Lacking  Modes of Intervention:  Support  Summary of Progress/Problems: Patient slept during group.  Did not participate due to sleepiness.   Cranford MonBeaudry, Arriona Prest Evans 06/19/2016, 9:04 AM

## 2016-06-19 NOTE — [facility] Group Notes (Signed)
[facility] LCSW Group Therapy 06/19/2016 1:15 PM Type of Therapy: Group Therapy Participation Level: Active  Participation Quality: Attentive  Affect: Depressed and Flat  Cognitive: Alert and Oriented  Insight: Developing/Improving and Engaged  Engagement in Therapy: Developing/Improving and Engaged  Modes of Intervention: Activity, Clarification, Confrontation, Discussion, Education, Exploration, Limit-setting, Orientation, Problem-solving, Rapport Building, Dance movement psychotherapisteality Testing, Socialization and Support  Summary of Progress/Problems: Patient was attentive and engaged with speaker from [employer]. Patient was attentive to speaker while they shared their story of dealing with mental health and overcoming it. Patient expressed interest in their programs and services and received information on their agency. Patient processed ways they can relate to the speaker. Patient left group several times due to coughing.  Samuella BruinKristin Meleny Tregoning, LCSW Clinical Social Worker [facility] [phone]

## 2016-06-19 NOTE — Progress Notes (Signed)
Recreation Therapy Notes  Animal-Assisted Activity (AAA) Program Checklist/Progress Notes Patient Eligibility Criteria Checklist & Daily Group note for Rec TxIntervention  Date: 09.05.2017 Time: 2:45pm [address]    AAA/T Program Assumption of Risk Form signed by Patient/ or Parent Legal Guardian Yes  Patient is free of allergies or sever asthma Yes  Patient reports no fear of animals Yes  Patient reports no history of cruelty to animals Yes  Patient understands his/her participation is voluntary Yes  Behavioral Response: Did not attend.  Marykay Lexenise L Jermiya Reichl, LRT/CTRS  Ryleah Miramontes L 06/19/2016 2:58 PM

## 2016-06-19 NOTE — Progress Notes (Signed)
[facility] MD Progress Note  06/19/2016 11:45 AM Sydney Gonzales  MRN:  [id number] Subjective:  Patient states " I am still depressed. I am suicidal, I am trying to figure out a plan."  Objective:Sydney Gonzales is a  60 year old AA female who was admitted to [facility] from the [facility] with complaints of worsening symptoms of depression & SI.  Patient seen and chart reviewed.Discussed patient with treatment team.  Pt today is seen as depressed, paranoid and suicidal. Pt reports she had a restless night last night and continues to struggle with thoughts of self harm. Pt continues to ruminate about her several stressors - relational issues with daughter and lack of social support.  Discussed adding a mood stabilizer like lamictal - she agrees with plan.   Principal Problem: Bipolar disorder, curr episode depressed, severe, w/psychotic features (HCC) Diagnosis:   Patient Active Problem List   Diagnosis Date Noted  . Bipolar disorder, curr episode depressed, severe, w/psychotic features (HCC) [F31.5] 06/18/2016  . PTSD (post-traumatic stress disorder) [F43.10] 06/18/2016  . Opioid use disorder, mild, on maintenance therapy [F11.90] 06/18/2016  . Cocaine use disorder, mild, abuse [F14.10] 06/18/2016   Total Time spent with patient: 25 minutes  Past Psychiatric History:hx of bipolar disorder - pls see H&p.  Past Medical History:  Past Medical History:  Diagnosis Date  . Abdominal adhesions   . Anxiety   . Chronic abdominal pain   . Chronic back pain   . Hepatitis C   . Migraine     Past Surgical History:  Procedure Laterality Date  . ABDOMINAL HYSTERECTOMY    . APPENDECTOMY    . CHOLECYSTECTOMY    . GALLBLADDER SURGERY     Family History: Please see H&P.  Family Psychiatric  History: Please see H&P.  Social History: Please see H&P.  History  Alcohol Use No     History  Drug Use No    Social History   Social History  . Marital status: Divorced    Spouse name: N/A  . Number of  children: N/A  . Years of education: N/A   Social History Main Topics  . Smoking status: Current Every Day Smoker    Packs/day: 0.50  . Smokeless tobacco: Never Used  . Alcohol use No  . Drug use: No  . Sexual activity: Not Asked   Other Topics Concern  . None   Social History Narrative   ** Merged History Encounter **       Additional Social History:                         Sleep: Poor  Appetite:  Fair  Current Medications: Current Facility-Administered Medications  Medication Dose Route Frequency Provider Last Rate Last Dose  . alum & mag hydroxide-simeth (MAALOX/MYLANTA) 200-200-20 MG/5ML suspension 30 mL  30 mL Oral Q4H PRN Shuvon B Rankin, NP      . benzonatate (TESSALON) capsule 200 mg  200 mg Oral TID PRN Kerry Hough, PA-C   200 mg at 06/19/16 0314  . busPIRone (BUSPAR) tablet 10 mg  10 mg Oral TID Shuvon B Rankin, NP   10 mg at 06/19/16 0752  . cholecalciferol (VITAMIN D) tablet 2,000 Units  2,000 Units Oral Daily Craige Cotta, MD   2,000 Units at 06/19/16 0747  . feeding supplement (ENSURE ENLIVE) (ENSURE ENLIVE) liquid 237 mL  237 mL Oral BID BM Shuvon B Rankin, NP   237 mL at 06/19/16  1047  . gabapentin (NEURONTIN) capsule 200 mg  200 mg Oral [id number] Mery Guadalupe, MD      . guaiFENesin (MUCINEX) 12 hr tablet 1,200 mg  1,200 mg Oral BID PRN Kerry HoughSpencer E Simon, PA-C   1,200 mg at 06/18/16 2146  . hydrOXYzine (ATARAX/VISTARIL) tablet 50 mg  50 mg Oral QHS,MR X 1 Kerry HoughSpencer E Simon, PA-C   50 mg at 06/18/16 2146  . lamoTRIgine (LAMICTAL) tablet 25 mg  25 mg Oral Daily Alyra Patty, MD      . magnesium hydroxide (MILK OF MAGNESIA) suspension 30 mL  30 mL Oral Daily PRN Shuvon B Rankin, NP      . methadone (DOLOPHINE) tablet 67.5 mg  67.5 mg Oral Daily Shuvon B Rankin, NP   67.5 mg at 06/19/16 0756  . nicotine (NICODERM CQ - dosed in mg/24 hours) patch 21 mg  21 mg Transdermal Daily Craige CottaFernando A Cobos, MD   21 mg at 06/19/16 0748  . PARoxetine (PAXIL)  tablet 20 mg  20 mg Oral Daily Shuvon B Rankin, NP   20 mg at 06/19/16 0748  . potassium chloride SA (K-DUR,KLOR-CON) CR tablet 20 mEq  20 mEq Oral Daily Shuvon B Rankin, NP   20 mEq at 06/19/16 0747  . QUEtiapine (SEROQUEL) tablet 400 mg  400 mg Oral QHS Jomarie LongsSaramma Winferd Wease, MD        Lab Results: No results found for this or any previous visit (from the past 48 hour(s)).  Blood Alcohol level:  Lab Results  Component Value Date   ETH <5 06/15/2016   ETH <5 06/14/2016    Metabolic Disorder Labs: Lab Results  Component Value Date   HGBA1C 5.6 03/18/2016   MPG 114 03/18/2016   Lab Results  Component Value Date   PROLACTIN 6.5 03/18/2016   Lab Results  Component Value Date   CHOL 172 03/18/2016   TRIG 96 03/18/2016   HDL 54 03/18/2016   CHOLHDL 3.2 03/18/2016   VLDL 19 03/18/2016   LDLCALC 99 03/18/2016    Physical Findings: AIMS: Facial and Oral Movements Muscles of Facial Expression: None, normal Lips and Perioral Area: None, normal Jaw: None, normal Tongue: None, normal,Extremity Movements Upper (arms, wrists, hands, fingers): None, normal Lower (legs, knees, ankles, toes): None, normal, Trunk Movements Neck, shoulders, hips: None, normal, Overall Severity Severity of abnormal movements (highest score from questions above): None, normal Incapacitation due to abnormal movements: None, normal Patient's awareness of abnormal movements (rate only patient's report): No Awareness, Dental Status Current problems with teeth and/or dentures?: Yes Does patient usually wear dentures?: Yes  CIWA:    COWS:     Musculoskeletal: Strength & Muscle Tone: within normal limits Gait & Station: normal Patient leans: N/A  Psychiatric Specialty Exam: Physical Exam  Nursing note and vitals reviewed.   Review of Systems  Constitutional: Negative for chills and fever.  HENT: Negative for congestion and sore throat.   Respiratory: Positive for cough.   Neurological: Negative for  headaches.  Psychiatric/Behavioral: Positive for depression and suicidal ideas. The patient is nervous/anxious.   All other systems reviewed and are negative.   Blood pressure (!) 89/63, pulse 98, temperature 98.6 F (37 C), temperature source Oral, resp. rate 16, height 5\' 8"  (1.727 m), weight 82.1 kg (181 lb), SpO2 98 %.Body mass index is 27.52 kg/m.  General Appearance: Disheveled  Eye Contact:  Fair  Speech:  Normal Rate  Volume:  Decreased  Mood:  Anxious, Depressed and Dysphoric  Affect:  Depressed  Thought Process:  Goal Directed and Descriptions of Associations: Circumstantial  Orientation:  Full (Time, Place, and Person)  Thought Content:  Paranoid Ideation and Rumination  Suicidal Thoughts:  Yes.  with intent/plan patient contracts for safety on the unit   Homicidal Thoughts:  No  Memory:  Immediate;   Fair Recent;   Fair Remote;   Fair  Judgement:  Fair  Insight:  Shallow  Psychomotor Activity:  Restlessness  Concentration:  Concentration: Poor and Attention Span: Fair  Recall:  [employer] of Knowledge:  Fair  Language:  Fair  Akathisia:  No  Handed:  Right  AIMS (if indicated):     Assets:  Desire for Improvement  ADL's:  Intact  Cognition:  WNL  Sleep:  Number of Hours: 4.25     Treatment Plan Summary:Sydney Gonzales is a  60 year old AA female who was admitted to [facility] from the [facility] with complaints of worsening symptoms of depression & SI. Patient today continues to be depressed and has sleep issues, continues to be suicidal, will continue treatment.  Daily contact with patient to assess and evaluate symptoms and progress in treatment and Medication management   Will continue Paxil 20 mg po daily for affective sx. Will continue Buspar 10 mg po tid for anxiety sx. Will increase Gabapentin to 200 mg po bid for mood sx. Will add Lamictal 25 mg po daily for bipolar do sx. Will increase Seroquel to 400 mg po qhs for psychosis, mood, sleep. Restarted home  medications where indicated. Will continue symptomatic treatment for cough - NP Velna Hatchet May to assess patient today. Reviewed past medical records,treatment plan.  Will continue to monitor vitals ,medication compliance and treatment side effects while patient is here.  Will monitor for medical issues as well as call consult as needed.  Reviewed labs ,will order tsh, lipid panel, hba1c. CSW will continue working on disposition.  Patient to participate in therapeutic milieu .      Oney Tatlock, MD 06/19/2016, 11:45 AM

## 2016-06-19 NOTE — Progress Notes (Signed)
DAR NOTE: Patient presents with anxious affect and depressed mood.  Denies pain, auditory and visual hallucinations. Pt stated she had a fair night sleep, poor appetite, low energy, and poor concentration. Pt complained of cough, PRN given as scheduled.  Rates depression at 8, hopelessness at 9, and anxiety at 9.  Maintained on routine safety checks.  Medications given as prescribed.  Support and encouragement offered as needed.  Attended group and participated.  States goal for today is " housing."  Patient observed socializing with peers in the dayroom.  Offered no complaint.

## 2016-06-19 NOTE — Progress Notes (Signed)
Patient c/o cough.  She reports that it has been 3 days.  No c/o chest pain, afebrile, no tachycardia.  Lungs clear upon auscultation but diminished to bases.  Patient history of 1/2 ppd.  Duoneb nebulizer treatment.

## 2016-06-20 DIAGNOSIS — F17210 Nicotine dependence, cigarettes, uncomplicated: Secondary | ICD-10-CM

## 2016-06-20 DIAGNOSIS — F4310 Post-traumatic stress disorder, unspecified: Secondary | ICD-10-CM

## 2016-06-20 DIAGNOSIS — F1410 Cocaine abuse, uncomplicated: Secondary | ICD-10-CM

## 2016-06-20 DIAGNOSIS — F1190 Opioid use, unspecified, uncomplicated: Secondary | ICD-10-CM

## 2016-06-20 LAB — LIPID PANEL
CHOLESTEROL: 177 mg/dL (ref 0–200)
HDL: 50 mg/dL (ref >40)
LDL Cholesterol: 102 mg/dL — ABNORMAL HIGH (ref 0–99)
TRIGLYCERIDES: 124 mg/dL (ref <150)
Total CHOL/HDL Ratio: 3.5 RATIO
VLDL: 25 mg/dL (ref 0–40)

## 2016-06-20 LAB — TSH: TSH: 2.429 u[IU]/mL (ref 0.350–4.500)

## 2016-06-20 NOTE — Progress Notes (Signed)
[facility] Rogelia BogaMichael Pierce inquiring if patient is able to return to shelter and about bed availability. Awaiting return call.  Samuella BruinKristin Vennela Jutte, LCSW Clinical Social Worker [facility] [phone]

## 2016-06-20 NOTE — Progress Notes (Signed)
Sydney Gonzales. Sydney Gonzales had been up and visible in milieu this evening, did attend and participate in evening group activity. Sydney Gonzales has appeared depressed and did endorse depression and passive suicidal thoughts and spoke about how she feels she does not have a reason to live but is able to contract for safety while in the hospital. Sydney Gonzales did receive all bedtime medications without incident. A. Support and encouragement provided. R. Safety maintained, will continue to monitor.

## 2016-06-20 NOTE — [facility] Group Notes (Signed)
[facility] LCSW Group Therapy  06/20/2016   1:15 PM   Type of Therapy:  Group Therapy  Participation Level:  Minimal  Participation Quality:  Attentive  Affect:  Depressed and Flat  Cognitive:  Alert and Oriented  Insight:  Developing/Improving and Engaged  Engagement in Therapy:  Developing/Improving and Engaged  Modes of Intervention:  Clarification, Confrontation, Discussion, Education, Exploration, Limit-setting, Orientation, Problem-solving, Rapport Building, Dance movement psychotherapisteality Testing, Socialization and Support  Summary of Progress/Problems: The topic for group therapy was feelings about diagnosis.  Pt actively participated in group discussion on their past and current diagnosis and how they feel towards this.  Pt also identified how society and family members judge them, based on their diagnosis as well as stereotypes and stigmas.  Patient did not participate in discussion despite CSW encouragement.   Sydney Gonzales, MSW, LCSW Clinical Social Worker [facility] [phone]

## 2016-06-20 NOTE — Progress Notes (Signed)
Adult Psychoeducational Group Note  Date:  06/20/2016 Time:  4:52 PM  Group Topic/Focus:  Goals Group:   The focus of this group is to help patients establish daily goals to achieve during treatment and discuss how the patient can incorporate goal setting into their daily lives to aide in recovery.   Participation Level:  Active  Participation Quality:  Appropriate  Affect:  Appropriate  Cognitive:  Appropriate  Insight: Appropriate  Engagement in Group:  Engaged  Modes of Intervention:  Discussion  Additional Comments:  Pt stated she was feeling up and down today, depressed,and hopeless because of her housing situation.  Pt stated her goal for the day was making phone calls to see if any beds are available for her to stay. Wynema BirchCagle, Makaio Mach D 06/20/2016, 4:52 PM

## 2016-06-20 NOTE — Progress Notes (Signed)
Recreation Therapy Notes  Date: 06/20/16 Time: 0930 Location: [address] Group Room  Group Topic: Stress Management  Goal Area(s) Addresses:  Patient will verbalize importance of using healthy stress management.  Patient will identify positive emotions associated with healthy stress management.   Behavioral Response: Engaged  Intervention: Stress Management  Activity :  Progressive Muscle Relaxation.  LRT introduced the technique of progressive muscle relaxation to patients.  LRT read script so patients to engage in technique.  Patients were to sit in a comfortable position and follow along as LRT read script.  Education:  Stress Management, Discharge Planning.   Education Outcome: Acknowledges edcuation/In group clarification offered/Needs additional education  Clinical Observations/Feedback: Pt attended group.    Caroll RancherMarjette Novice Vrba, LRT/CTRS         Caroll RancherLindsay, Zyionna Pesce A 06/20/2016 12:15 PM

## 2016-06-20 NOTE — Progress Notes (Signed)
Pt attended NA group this evening.  

## 2016-06-20 NOTE — Progress Notes (Signed)
DAR NOTE: Patient presents with anxious affect and depressed mood. Pt continues to complain about cough, breathing treatment given as scheduled. Pt has been present in the day. area interacting with peers. Denies pain, auditory and visual hallucinations.  Rates depression at 6, hopelessness at 4, and anxiety at at 6.  Maintained on routine safety checks.  Medications given as prescribed.  Support and encouragement offered as needed.  States goal for today is " reading the bible."  Offered no complaint.

## 2016-06-20 NOTE — Progress Notes (Signed)
Patient ID: Sydney Gonzales, female   DOB: [birth year], 60 y.o.   MRN: [id number] [facility] MD Progress Note  06/20/2016 4:13 PM Sydney Gonzales  MRN:  [id number]  Subjective: Sydney Gonzales states " I am still depressed. I am feeling down & suicidal. I am more suicidal today than I was yesterday"  Objective: Sydney Gonzales is a  60 year old AA female who was admitted to [facility] from the [facility] with complaints of worsening symptoms of depression & SI.  Patient seen and chart reviewed. Discussed patient with treatment team.  Pt today is seen as depressed, paranoid and suicidal. Pt reports she had a restless night last night and continues to struggle with thoughts of self harm. Pt continues to ruminate about her several stressors - relationship issues with daughter and lack of social support. She continues on the mood stabilizer lamictal & other medication regimen without any adverse effects  Reported.  Principal Problem: Bipolar disorder, curr episode depressed, severe, w/psychotic features (HCC) Diagnosis:   Patient Active Problem List   Diagnosis Date Noted  . Bipolar disorder, curr episode depressed, severe, w/psychotic features (HCC) [F31.5] 06/18/2016    Priority: High  . Cocaine use disorder, mild, abuse [F14.10] 06/18/2016    Priority: Medium  . PTSD (post-traumatic stress disorder) [F43.10] 06/18/2016  . Opioid use disorder, mild, on maintenance therapy [F11.90] 06/18/2016   Total Time spent with patient: 15 minutes  Past Psychiatric History: Hx of bipolar disorder - pls see H&p.  Past Medical History:  Past Medical History:  Diagnosis Date  . Abdominal adhesions   . Anxiety   . Chronic abdominal pain   . Chronic back pain   . Hepatitis C   . Migraine     Past Surgical History:  Procedure Laterality Date  . ABDOMINAL HYSTERECTOMY    . APPENDECTOMY    . CHOLECYSTECTOMY    . GALLBLADDER SURGERY     Family History: Please see H&P.  Family Psychiatric  History: Please see H&P.  Social History:  Please see H&P.  History  Alcohol Use No     History  Drug Use No    Social History   Social History  . Marital status: Divorced    Spouse name: N/A  . Number of children: N/A  . Years of education: N/A   Social History Main Topics  . Smoking status: Current Every Day Smoker    Packs/day: 0.50  . Smokeless tobacco: Never Used  . Alcohol use No  . Drug use: No  . Sexual activity: Not Asked   Other Topics Concern  . None   Social History Narrative   ** Merged History Encounter **       Additional Social History:   Sleep: Poor  Appetite:  Fair  Current Medications: Current Facility-Administered Medications  Medication Dose Route Frequency Provider Last Rate Last Dose  . alum & mag hydroxide-simeth (MAALOX/MYLANTA) 200-200-20 MG/5ML suspension 30 mL  30 mL Oral Q4H PRN Shuvon B Rankin, NP      . benzonatate (TESSALON) capsule 200 mg  200 mg Oral TID PRN Kerry Hough, PA-C   200 mg at 06/19/16 2054  . busPIRone (BUSPAR) tablet 10 mg  10 mg Oral TID Shuvon B Rankin, NP   10 mg at 06/20/16 1210  . cholecalciferol (VITAMIN D) tablet 2,000 Units  2,000 Units Oral Daily Craige Cotta, MD   2,000 Units at 06/20/16 0801  . feeding supplement (ENSURE ENLIVE) (ENSURE ENLIVE) liquid 237 mL  237 mL  Oral BID BM Shuvon B Rankin, NP   237 mL at 06/20/16 1443  . gabapentin (NEURONTIN) capsule 200 mg  200 mg Oral [id number] Saramma Eappen, MD   200 mg at 06/20/16 1443  . guaiFENesin (MUCINEX) 12 hr tablet 1,200 mg  1,200 mg Oral BID PRN Kerry Hough, PA-C   1,200 mg at 06/19/16 2054  . hydrOXYzine (ATARAX/VISTARIL) tablet 50 mg  50 mg Oral QHS,MR X 1 Kerry Hough, PA-C   50 mg at 06/19/16 2053  . ipratropium-albuterol (DUONEB) 0.5-2.5 (3) MG/3ML nebulizer solution 3 mL  3 mL Nebulization Q6H Adonis Brook, NP   3 mL at 06/20/16 1443  . lamoTRIgine (LAMICTAL) tablet 25 mg  25 mg Oral Daily Jomarie Longs, MD   25 mg at 06/20/16 0801  . magnesium hydroxide (MILK OF MAGNESIA)  suspension 30 mL  30 mL Oral Daily PRN Shuvon B Rankin, NP      . methadone (DOLOPHINE) tablet 67.5 mg  67.5 mg Oral Daily Shuvon B Rankin, NP   67.5 mg at 06/20/16 0801  . nicotine (NICODERM CQ - dosed in mg/24 hours) patch 21 mg  21 mg Transdermal Daily Craige Cotta, MD   21 mg at 06/20/16 0803  . PARoxetine (PAXIL) tablet 20 mg  20 mg Oral Daily Shuvon B Rankin, NP   20 mg at 06/20/16 0802  . potassium chloride SA (K-DUR,KLOR-CON) CR tablet 20 mEq  20 mEq Oral Daily Shuvon B Rankin, NP   20 mEq at 06/20/16 0801  . QUEtiapine (SEROQUEL) tablet 400 mg  400 mg Oral QHS Jomarie Longs, MD   400 mg at 06/19/16 2054    Lab Results:  Results for orders placed or performed during the hospital encounter of 06/17/16 (from the past 48 hour(s))  TSH     Status: None   Collection Time: 06/20/16  6:48 AM  Result Value Ref Range   TSH 2.429 0.350 - 4.500 uIU/mL    Comment: Performed at [facility]  Lipid panel     Status: Abnormal   Collection Time: 06/20/16  6:48 AM  Result Value Ref Range   Cholesterol 177 0 - 200 mg/dL   Triglycerides 161 <096 mg/dL   HDL 50 >04 mg/dL   Total CHOL/HDL Ratio 3.5 RATIO   VLDL 25 0 - 40 mg/dL   LDL Cholesterol 540 (H) 0 - 99 mg/dL    Comment:        Total Cholesterol/HDL:CHD Risk Coronary Heart Disease Risk Table                     Men   Women  1/2 Average Risk   3.4   3.3  Average Risk       5.0   4.4  2 X Average Risk   9.6   7.1  3 X Average Risk  23.4   11.0        Use the calculated Patient Ratio above and the CHD Risk Table to determine the patient's CHD Risk.        ATP III CLASSIFICATION (LDL):  <100     mg/dL   Optimal  981-191  mg/dL   Near or Above                    Optimal  130-159  mg/dL   Borderline  478-295  mg/dL   High  >621     mg/dL   Very High Performed at  [facility]     Blood Alcohol level:  Lab Results  Component Value Date   [facility] <5 06/15/2016   ETH <5 06/14/2016    Metabolic Disorder  Labs: Lab Results  Component Value Date   HGBA1C 5.6 03/18/2016   MPG 114 03/18/2016   Lab Results  Component Value Date   PROLACTIN 6.5 03/18/2016   Lab Results  Component Value Date   CHOL 177 06/20/2016   TRIG 124 06/20/2016   HDL 50 06/20/2016   CHOLHDL 3.5 06/20/2016   VLDL 25 06/20/2016   LDLCALC 102 (H) 06/20/2016   LDLCALC 99 03/18/2016   Physical Findings: AIMS: Facial and Oral Movements Muscles of Facial Expression: None, normal Lips and Perioral Area: None, normal Jaw: None, normal Tongue: None, normal,Extremity Movements Upper (arms, wrists, hands, fingers): None, normal Lower (legs, knees, ankles, toes): None, normal, Trunk Movements Neck, shoulders, hips: None, normal, Overall Severity Severity of abnormal movements (highest score from questions above): None, normal Incapacitation due to abnormal movements: None, normal Patient's awareness of abnormal movements (rate only patient's report): No Awareness, Dental Status Current problems with teeth and/or dentures?: Yes Does patient usually wear dentures?: Yes  CIWA:    COWS:     Musculoskeletal: Strength & Muscle Tone: within normal limits Gait & Station: normal Patient leans: N/A  Psychiatric Specialty Exam: Physical Exam  Nursing note and vitals reviewed.   Review of Systems  Constitutional: Negative for chills and fever.  HENT: Negative for congestion and sore throat.   Respiratory: Positive for cough.   Neurological: Negative for headaches.  Psychiatric/Behavioral: Positive for depression and suicidal ideas. The patient is nervous/anxious.   All other systems reviewed and are negative.   Blood pressure 93/65, pulse (!) 101, temperature 97.6 F (36.4 C), temperature source Oral, resp. rate 16, height 5\' 8"  (1.727 m), weight 82.1 kg (181 lb), SpO2 98 %.Body mass index is 27.52 kg/m.  General Appearance: Disheveled  Eye Contact:  Fair  Speech:  Normal Rate  Volume:  Decreased  Mood:  Anxious,  Depressed and Dysphoric  Affect:  Depressed  Thought Process:  Goal Directed and Descriptions of Associations: Circumstantial  Orientation:  Full (Time, Place, and Person)  Thought Content:  Paranoid Ideation and Rumination  Suicidal Thoughts:  Yes.  with intent/plan patient contracts for safety on the unit   Homicidal Thoughts:  No  Memory:  Immediate;   Fair Recent;   Fair Remote;   Fair  Judgement:  Fair  Insight:  Shallow  Psychomotor Activity:  Restlessness  Concentration:  Concentration: Poor and Attention Span: Fair  Recall:  [employer] of Knowledge:  Fair  Language:  Fair  Akathisia:  No  Handed:  Right  AIMS (if indicated):     Assets:  Desire for Improvement  ADL's:  Intact  Cognition:  WNL  Sleep:  Number of Hours: 5   Treatment Plan Summary: Kenzleigh is a  60 year old AA female who was admitted to [facility] from the [facility] with complaints of worsening symptoms of depression & SI. Patient today continues to be depressed and has sleep issues, continues to be suicidal, will continue treatment.  Daily contact with patient to assess and evaluate symptoms and progress in treatment and Medication management  Will continue Paxil 20 mg po daily for affective sx. Will continue Buspar 10 mg po tid for anxiety sx. Will continue Gabapentin 200 mg po bid for mood sx. Will continue Lamictal 25 mg po daily for  bipolar do sx. Will continue Seroquel 400 mg po qhs for psychosis, mood, sleep. Restarted home medications where indicated. Will continue symptomatic treatment for cough - NP Velna HatchetSheila May to assess patient today. Reviewed past medical records,treatment plan.  Will continue to monitor vitals ,medication compliance and treatment side effects while patient is here.  Will monitor for medical issues as well as call consult as needed.  Reviewed labs ,will order tsh, lipid panel, hba1c. CSW will continue working on disposition.  Patient to participate in therapeutic milieu .    Sanjuana KavaNwoko, Agnes I, NP, PMHNP, FNP-BC 06/20/2016, 4:13 PM   Agree with NP progress note as above  Dr. Jama Flavorsobos

## 2016-06-21 ENCOUNTER — Other Ambulatory Visit: Payer: Self-pay

## 2016-06-21 ENCOUNTER — Encounter (HOSPITAL_COMMUNITY): Payer: Self-pay | Admitting: Emergency Medicine

## 2016-06-21 LAB — HEMOGLOBIN A1C
Hgb A1c MFr Bld: 5.5 % (ref 4.8–5.6)
MEAN PLASMA GLUCOSE: 111 mg/dL

## 2016-06-21 MED ORDER — GABAPENTIN 300 MG PO CAPS
300.0000 mg | ORAL_CAPSULE | ORAL | Status: DC
  Administered 2016-06-21 – 2016-06-26 (×15): 300 mg via ORAL
  Filled 2016-06-21 (×22): qty 1

## 2016-06-21 MED ORDER — ACETAMINOPHEN 325 MG PO TABS
650.0000 mg | ORAL_TABLET | Freq: Four times a day (QID) | ORAL | Status: DC | PRN
  Administered 2016-06-21 – 2016-06-26 (×7): 650 mg via ORAL
  Filled 2016-06-21 (×8): qty 2

## 2016-06-21 NOTE — Progress Notes (Addendum)
Patient ID: Sydney LatheCarol Gonzales, female   DOB: Feb 24, 1956, 60 y.o.   MRN: [id number] Patient ID: Sydney LatheCarol Gonzales, female   DOB: Feb 24, 1956, 60 y.o.   MRN: [id number] [facility] MD Progress Note  06/21/2016 10:53 AM Sydney Gonzales  MRN:  [id number]  Subjective: Sydney Gonzales states "I'm depressed. I don't think that I'm doing any better. Everyone keep asking me how I'm doing? Well, if you must know, I'm not well. I don't want to be here. I cannot be discharged out there without a place to go to or live, I will take my life. I don't care no more"  Objective: Sydney Gonzales is a  60 year old AA female who [facility] with complaints of worsening symptoms of depression & SI.  Patient seen and chart reviewed. Discussed patient with treatment team. Pt reports that she is depressed & does not want be here any more (meaning that she is feeling suicidal). She cried & was upset during this follow-up assessment as she states that she does not want to be discharged as she has no place to go or live after discharge. Pt continues to ruminate about her several stressors - relationship issues with daughter and lack of social support. She continues on the mood stabilizer lamictal & other medication regimen without any adverse effects reported. Sydney Gonzales is noted to be participating in group sessions. She presents as cheerful & good affect when interacting with the other patients. She appears to be in no apparent distress.   Principal Problem: Bipolar disorder, curr episode depressed, severe, w/psychotic features (HCC) Diagnosis:   Patient Active Problem List   Diagnosis Date Noted  . Bipolar disorder, curr episode depressed, severe, w/psychotic features (HCC) [F31.5] 06/18/2016    Priority: High  . Cocaine use disorder, mild, abuse [F14.10] 06/18/2016    Priority: Medium  . PTSD (post-traumatic stress disorder) [F43.10] 06/18/2016  . Opioid use disorder, mild, on maintenance therapy [F11.90] 06/18/2016   Total Time spent  with patient: 15 minutes  Past Psychiatric History: Hx of bipolar disorder - pls see H&p.  Past Medical History:  Past Medical History:  Diagnosis Date  . Abdominal adhesions   . Anxiety   . Chronic abdominal pain   . Chronic back pain   . Hepatitis C   . Migraine     Past Surgical History:  Procedure Laterality Date  . ABDOMINAL HYSTERECTOMY    . APPENDECTOMY    . CHOLECYSTECTOMY    . GALLBLADDER SURGERY     Family History: Please see H&P.  Family Psychiatric  History: Please see H&P.  Social History: Please see H&P.  History  Alcohol Use No     History  Drug Use No    Social History   Social History  . Marital status: Divorced    Spouse name: N/A  . Number of children: N/A  . Years of education: N/A   Social History Main Topics  . Smoking status: Current Every Day Smoker    Packs/day: 0.50  . Smokeless tobacco: Never Used  . Alcohol use No  . Drug use: No  . Sexual activity: Not Asked   Other Topics Concern  . None   Social History Narrative   ** Merged History Encounter **       Additional Social History:   Sleep: Poor  Appetite:  Fair  Current Medications: Current Facility-Administered Medications  Medication Dose Route Frequency Provider Last Rate Last Dose  . acetaminophen (TYLENOL) tablet 650 mg  650 mg Oral Q6H PRN Kerry Hough, PA-C      . alum & mag hydroxide-simeth (MAALOX/MYLANTA) 200-200-20 MG/5ML suspension 30 mL  30 mL Oral Q4H PRN Shuvon B Rankin, NP      . benzonatate (TESSALON) capsule 200 mg  200 mg Oral TID PRN Kerry Hough, PA-C   200 mg at 06/19/16 2054  . busPIRone (BUSPAR) tablet 10 mg  10 mg Oral TID Shuvon B Rankin, NP   10 mg at 06/21/16 0813  . cholecalciferol (VITAMIN D) tablet 2,000 Units  2,000 Units Oral Daily Craige Cotta, MD   2,000 Units at 06/21/16 0813  . feeding supplement (ENSURE ENLIVE) (ENSURE ENLIVE) liquid 237 mL  237 mL Oral BID BM Shuvon B Rankin, NP   237 mL at 06/21/16 0824  . gabapentin  (NEURONTIN) capsule 200 mg  200 mg Oral [id number] Jomarie Longs, MD   200 mg at 06/21/16 0813  . guaiFENesin (MUCINEX) 12 hr tablet 1,200 mg  1,200 mg Oral BID PRN Kerry Hough, PA-C   1,200 mg at 06/19/16 2054  . hydrOXYzine (ATARAX/VISTARIL) tablet 50 mg  50 mg Oral QHS,MR X 1 Kerry Hough, PA-C   50 mg at 06/20/16 2134  . ipratropium-albuterol (DUONEB) 0.5-2.5 (3) MG/3ML nebulizer solution 3 mL  3 mL Nebulization Q6H Adonis Brook, NP   3 mL at 06/21/16 0813  . lamoTRIgine (LAMICTAL) tablet 25 mg  25 mg Oral Daily Jomarie Longs, MD   25 mg at 06/21/16 0813  . magnesium hydroxide (MILK OF MAGNESIA) suspension 30 mL  30 mL Oral Daily PRN Shuvon B Rankin, NP      . methadone (DOLOPHINE) tablet 67.5 mg  67.5 mg Oral Daily Shuvon B Rankin, NP   67.5 mg at 06/21/16 0812  . nicotine (NICODERM CQ - dosed in mg/24 hours) patch 21 mg  21 mg Transdermal Daily Craige Cotta, MD   21 mg at 06/21/16 0814  . PARoxetine (PAXIL) tablet 20 mg  20 mg Oral Daily Shuvon B Rankin, NP   20 mg at 06/21/16 0813  . potassium chloride SA (K-DUR,KLOR-CON) CR tablet 20 mEq  20 mEq Oral Daily Shuvon B Rankin, NP   20 mEq at 06/21/16 0813  . QUEtiapine (SEROQUEL) tablet 400 mg  400 mg Oral QHS Jomarie Longs, MD   400 mg at 06/20/16 2134    Lab Results:  Results for orders placed or performed during the hospital encounter of 06/17/16 (from the past 48 hour(s))  TSH     Status: None   Collection Time: 06/20/16  6:48 AM  Result Value Ref Range   TSH 2.429 0.350 - 4.500 uIU/mL    Comment: Performed at [facility]  Lipid panel     Status: Abnormal   Collection Time: 06/20/16  6:48 AM  Result Value Ref Range   Cholesterol 177 0 - 200 mg/dL   Triglycerides 409 <811 mg/dL   HDL 50 >91 mg/dL   Total CHOL/HDL Ratio 3.5 RATIO   VLDL 25 0 - 40 mg/dL   LDL Cholesterol 478 (H) 0 - 99 mg/dL    Comment:        Total Cholesterol/HDL:CHD Risk Coronary Heart Disease Risk Table                      Men   Women  1/2 Average Risk   3.4   3.3  Average Risk       5.0  4.4  2 X Average Risk   9.6   7.1  3 X Average Risk  23.4   11.0        Use the calculated Patient Ratio above and the CHD Risk Table to determine the patient's CHD Risk.        ATP III CLASSIFICATION (LDL):  <100     mg/dL   Optimal  914-782  mg/dL   Near or Above                    Optimal  130-159  mg/dL   Borderline  956-213  mg/dL   High  >086     mg/dL   Very High Performed at [facility]   Hemoglobin A1c     Status: None   Collection Time: 06/20/16  6:48 AM  Result Value Ref Range   Hgb A1c MFr Bld 5.5 4.8 - 5.6 %    Comment: (NOTE)         Pre-diabetes: 5.7 - 6.4         Diabetes: >6.4         Glycemic control for adults with diabetes: <7.0    Mean Plasma Glucose 111 mg/dL    Comment: (NOTE) Performed At: [facility] [address] [place], [place] [id number] Mila Homer MD [id number] Performed at [facility]     Blood Alcohol level:  Lab Results  Component Value Date   [facility] <5 06/15/2016   ETH <5 06/14/2016    Metabolic Disorder Labs: Lab Results  Component Value Date   HGBA1C 5.5 06/20/2016   MPG 111 06/20/2016   MPG 114 03/18/2016   Lab Results  Component Value Date   PROLACTIN 6.5 03/18/2016   Lab Results  Component Value Date   CHOL 177 06/20/2016   TRIG 124 06/20/2016   HDL 50 06/20/2016   CHOLHDL 3.5 06/20/2016   VLDL 25 06/20/2016   LDLCALC 102 (H) 06/20/2016   LDLCALC 99 03/18/2016   Physical Findings: AIMS: Facial and Oral Movements Muscles of Facial Expression: None, normal Lips and Perioral Area: None, normal Jaw: None, normal Tongue: None, normal,Extremity Movements Upper (arms, wrists, hands, fingers): None, normal Lower (legs, knees, ankles, toes): None, normal, Trunk Movements Neck, shoulders, hips: None, normal, Overall Severity Severity of abnormal movements (highest score from questions above): None,  normal Incapacitation due to abnormal movements: None, normal Patient's awareness of abnormal movements (rate only patient's report): No Awareness, Dental Status Current problems with teeth and/or dentures?: Yes Does patient usually wear dentures?: Yes  CIWA:    COWS:     Musculoskeletal: Strength & Muscle Tone: within normal limits Gait & Station: normal Patient leans: N/A  Psychiatric Specialty Exam: Physical Exam  Nursing note and vitals reviewed. Constitutional: She appears well-developed.  HENT:  Head: Normocephalic.  Eyes: Pupils are equal, round, and reactive to light.  Neck: Normal range of motion.  Cardiovascular: Normal rate.   Respiratory: Effort normal.  GI: Soft.  Genitourinary:  Genitourinary Comments: Denies any issues  Musculoskeletal: Normal range of motion.  Neurological: She is alert.  Skin: Skin is warm.    Review of Systems  Constitutional: Negative.  Negative for chills and fever.  HENT: Negative for congestion and sore throat.   Respiratory: Negative.   Gastrointestinal: Negative.   Genitourinary: Negative.   Musculoskeletal: Negative.   Skin: Negative.   Neurological: Positive for headaches.  Psychiatric/Behavioral: Positive for depression, substance abuse (Hx Cocaine abuse) and suicidal  ideas. Negative for hallucinations and memory loss. The patient is nervous/anxious and has insomnia ("Improving").   All other systems reviewed and are negative.   Blood pressure (!) 78/51, pulse 81, temperature 97.8 F (36.6 C), temperature source Oral, resp. rate 16, height 5\' 8"  (1.727 m), weight 82.1 kg (181 lb), SpO2 98 %.Body mass index is 27.52 kg/m.  General Appearance: Disheveled  Eye Contact:  Fair  Speech:  Normal Rate  Volume:  Decreased  Mood:  Anxious, Depressed and Dysphoric  Affect:  Depressed  Thought Process:  Goal Directed and Descriptions of Associations: Circumstantial  Orientation:  Full (Time, Place, and Person)  Thought Content:   Paranoid Ideation and Rumination  Suicidal Thoughts:  Yes.  with intent/plan patient contracts for safety on the unit   Homicidal Thoughts:  No  Memory:  Immediate;   Fair Recent;   Fair Remote;   Fair  Judgement:  Fair  Insight:  Shallow  Psychomotor Activity:  Restlessness  Concentration:  Concentration: Poor and Attention Span: Fair  Recall:  [employer] of Knowledge:  Fair  Language:  Fair  Akathisia:  No  Handed:  Right  AIMS (if indicated):     Assets:  Desire for Improvement  ADL's:  Intact  Cognition:  WNL  Sleep:  Number of Hours: 5.75   Treatment Plan Summary: Odelle is a  60 year old AA female who was admitted to [facility] from the [facility] with complaints of worsening symptoms of depression & SI. Patient today continues to be depressed and has sleep issues, continues to be suicidal, will continue treatment.  Daily contact with patient to assess and evaluate symptoms and progress in treatment and Medication management  Will continue Paxil 20 mg po daily for affective sx. Will continue Buspar 10 mg po tid for anxiety sx. Will continue Gabapentin 200 mg po bid for mood sx. Will continue Lamictal 25 mg po daily for bipolar do sx. Will continue Seroquel 400 mg po qhs for psychosis, mood, sleep. Restarted home medications where indicated. Will continue symptomatic treatment for cough - NP Velna Hatchet May to assess patient today. Reviewed past medical records,treatment plan.  Will continue to monitor vitals ,medication compliance and treatment side effects while patient is here.  Will monitor for medical issues as well as call consult as needed.  Reviewed labs ,will order tsh, lipid panel, hba1c. CSW will continue working on disposition.  Patient to participate in therapeutic milieu .  No changes made the current treatment plan.  Sanjuana Kava, NP, PMHNP, FNP-BC 06/21/2016, 10:53 AM

## 2016-06-21 NOTE — Clinical Social Work Note (Signed)
CSW left VM for shelter director, Rogelia BogaMichael Pierce, to see if patient can return at discharge.  Pt requested CSW attempt to contact shelter on her behalf.  Santa GeneraAnne Ashkan Chamberland, LCSW Lead Clinical Social Worker Phone:  [phone]

## 2016-06-21 NOTE — Progress Notes (Signed)
D: Pt at the time of assessment endorsed moderate anxiety, depression and mild L. hip pain; states, "I am very nervous right now" Pt however, denied SI, HI or AVH. Pt is however flat, isolative and withdrawn to self even while in the dayroom. Pt remained calm and cooperative. A: Medications offered as prescribed.  Support, encouragement, and safe environment provided.  15-minute safety checks continue.  R: Pt was med compliant.  Pt attended NA group. Safety checks continue.

## 2016-06-21 NOTE — ED Triage Notes (Signed)
Pt. Arrived with EMS from [facility] reports left upper arm pain radiating to left shoulder onset this evening , denies injury , no chest pain or SOB .

## 2016-06-21 NOTE — Progress Notes (Signed)
D: Pt presents with flat affect and depressed mood. Pt reports feeling increasingly depressed and suicidal this morning. Pt reports SI with no plan or intent. Pt verbally contracts for safety. Pt appeared sedated this morning after taking 67.5 of Methadone. When writer addressed sedation with pt, ptt reported that she was tired this morning because she did not sleep well last night. Pt requesting to have Methadone increased to 70 mg daily. MD. MD made aware of pt request.  A: Medications reviewed with pt. Mediations administered as ordered per MD. Verbal support provided. Pt encouraged to attend groups. 15 minute checks performed for safety. R: Pt receptive to tx. Pt verbalizes understanding of med regimen.

## 2016-06-21 NOTE — [facility] Group Notes (Signed)
[facility] LCSW Group Therapy  06/21/2016 2:37 PM  Type of Therapy:  Group Therapy  Participation Level:  Did Not Attend  Modes of Intervention:  Discussion, Education, Socialization and Support  Summary of Progress/Problems:Balance in life: Patients will discuss the concept of balance and how it looks and feels to be unbalanced. Pt will identify areas in their life that is unbalanced and ways to become more balanced.   Damany Eastman L Cassiel Fernandez MSW, LCSWA  06/21/2016, 2:37 PM   

## 2016-06-21 NOTE — Progress Notes (Addendum)
Pt at about 2200 complained about L. arm pain, then SOB. V/S taken immediately was BP-130/76, HR-86, R-20, O2-99% RA. Got an EKG per Leonette Mostharles K.-PA. [facility]. Pt denied any chest pain. Pt also denied any back, neck, jaw or stomach pain. Pt remained alert and oriented x4. Pt continued to have meaningful conversations with staffs.  At 2300 Pt left the unit on a stretcher via EMS. At the time of departure, Pt continued to be alert and oriented x4; continued to have meaningful conversations with RN and EMS staffs.

## 2016-06-21 NOTE — Progress Notes (Signed)
Pt attended karaoke group this evening.  

## 2016-06-22 LAB — CBC WITH DIFFERENTIAL/PLATELET
BASOS PCT: 0 %
Basophils Absolute: 0 10*3/uL (ref 0.0–0.1)
Eosinophils Absolute: 0.2 10*3/uL (ref 0.0–0.7)
Eosinophils Relative: 5 %
HEMATOCRIT: 34.5 % — AB (ref 36.0–46.0)
HEMOGLOBIN: 10.8 g/dL — AB (ref 12.0–15.0)
LYMPHS ABS: 1.7 10*3/uL (ref 0.7–4.0)
Lymphocytes Relative: 46 %
MCH: 28.5 pg (ref 26.0–34.0)
MCHC: 31.3 g/dL (ref 30.0–36.0)
MCV: 91 fL (ref 78.0–100.0)
MONOS PCT: 12 %
Monocytes Absolute: 0.4 10*3/uL (ref 0.1–1.0)
NEUTROS ABS: 1.4 10*3/uL — AB (ref 1.7–7.7)
NEUTROS PCT: 37 %
Platelets: 120 10*3/uL — ABNORMAL LOW (ref 150–400)
RBC: 3.79 MIL/uL — AB (ref 3.87–5.11)
RDW: 15.2 % (ref 11.5–15.5)
WBC: 3.7 10*3/uL — ABNORMAL LOW (ref 4.0–10.5)

## 2016-06-22 LAB — COMPREHENSIVE METABOLIC PANEL
ALBUMIN: 3.5 g/dL (ref 3.5–5.0)
ALK PHOS: 66 U/L (ref 38–126)
ALT: 20 U/L (ref 14–54)
ANION GAP: 7 (ref 5–15)
AST: 33 U/L (ref 15–41)
BILIRUBIN TOTAL: 0.2 mg/dL — AB (ref 0.3–1.2)
BUN: 9 mg/dL (ref 6–20)
CALCIUM: 9.4 mg/dL (ref 8.9–10.3)
CO2: 27 mmol/L (ref 22–32)
CREATININE: 0.74 mg/dL (ref 0.44–1.00)
Chloride: 103 mmol/L (ref 101–111)
GFR calc Af Amer: >60 mL/min (ref >60)
GFR calc non Af Amer: >60 mL/min (ref >60)
GLUCOSE: 106 mg/dL — AB (ref 65–99)
Potassium: 4.6 mmol/L (ref 3.5–5.1)
Sodium: 137 mmol/L (ref 135–145)
TOTAL PROTEIN: 7 g/dL (ref 6.5–8.1)

## 2016-06-22 MED ORDER — ACETAMINOPHEN 325 MG PO TABS
650.0000 mg | ORAL_TABLET | Freq: Once | ORAL | Status: AC
  Administered 2016-06-22: 650 mg via ORAL
  Filled 2016-06-22: qty 2

## 2016-06-22 NOTE — Tx Team (Signed)
Interdisciplinary Treatment and Diagnostic Plan Update  06/22/2016  Time of Session: 9:30am Sydney Gonzales MRN: [id number]  Principal Diagnosis: Bipolar disorder, curr episode depressed, severe, w/psychotic features (HCC)  Secondary Diagnoses: Principal Problem:   Bipolar disorder, curr episode depressed, severe, w/psychotic features (HCC) Active Problems:   PTSD (post-traumatic stress disorder)   Opioid use disorder, mild, on maintenance therapy   Cocaine use disorder, mild, abuse   Current Medications:           Current Facility-Administered Medications  Medication Dose Route Frequency Provider Last Rate Last Dose  . alum & mag hydroxide-simeth (MAALOX/MYLANTA) 200-200-20 MG/5ML suspension 30 mL  30 mL Oral Q4H PRN Shuvon B Rankin, NP      . benzonatate (TESSALON) capsule 200 mg  200 mg Oral TID PRN Kerry Hough, PA-C   200 mg at 06/19/16 0314  . busPIRone (BUSPAR) tablet 10 mg  10 mg Oral TID Shuvon B Rankin, NP   10 mg at 06/19/16 0752  . cholecalciferol (VITAMIN D) tablet 2,000 Units  2,000 Units Oral Daily Craige Cotta, MD   2,000 Units at 06/19/16 0747  . feeding supplement (ENSURE ENLIVE) (ENSURE ENLIVE) liquid 237 mL  237 mL Oral BID BM Shuvon B Rankin, NP   237 mL at 06/18/16 0821  . gabapentin (NEURONTIN) capsule 100 mg  100 mg Oral TID Jomarie Longs, MD   100 mg at 06/19/16 0748  . guaiFENesin (MUCINEX) 12 hr tablet 1,200 mg  1,200 mg Oral BID PRN Kerry Hough, PA-C   1,200 mg at 06/18/16 2146  . hydrOXYzine (ATARAX/VISTARIL) tablet 50 mg  50 mg Oral QHS,MR X 1 Kerry Hough, PA-C   50 mg at 06/18/16 2146  . magnesium hydroxide (MILK OF MAGNESIA) suspension 30 mL  30 mL Oral Daily PRN Shuvon B Rankin, NP      . methadone (DOLOPHINE) tablet 67.5 mg  67.5 mg Oral Daily Shuvon B Rankin, NP   67.5 mg at 06/19/16 0756  . nicotine (NICODERM CQ - dosed in mg/24 hours) patch 21 mg  21 mg Transdermal Daily Craige Cotta, MD   21 mg at 06/19/16 0748  . PARoxetine  (PAXIL) tablet 20 mg  20 mg Oral Daily Shuvon B Rankin, NP   20 mg at 06/19/16 0748  . potassium chloride SA (K-DUR,KLOR-CON) CR tablet 20 mEq  20 mEq Oral Daily Shuvon B Rankin, NP   20 mEq at 06/19/16 0747  . QUEtiapine (SEROQUEL) tablet 200 mg  200 mg Oral QHS Shuvon B Rankin, NP   200 mg at 06/18/16 2124   PTA Medications:        Prescriptions Prior to Admission  Medication Sig Dispense Refill Last Dose  . busPIRone (BUSPAR) 10 MG tablet Take 1 tablet (10 mg total) by mouth 3 (three) times daily. 90 tablet 0 a week  . Cholecalciferol (VITAMIN D3) 2000 units capsule Take 2,000 Units by mouth daily.   06/14/2016 at Unknown time  . dicyclomine (BENTYL) 20 MG tablet Take 1 tablet (20 mg total) by mouth 2 (two) times daily as needed for spasms. (Patient not taking: Reported on 06/15/2016) 10 tablet 0 Not Taking at Unknown time  . methadone (DOLOPHINE) 10 MG/ML solution Take 67.5 mg by mouth daily.    06/15/2016 at Unknown time  . PARoxetine (PAXIL) 20 MG tablet Take 1 tablet (20 mg total) by mouth daily. 30 tablet 0 a week  . potassium chloride SA (K-DUR,KLOR-CON) 20 MEQ tablet Take 1 tablet (20  mEq total) by mouth daily. 3 tablet 0 a week  . QUEtiapine (SEROQUEL) 200 MG tablet Take 200 mg by mouth at bedtime.   06/14/2016 at Unknown time  . vitamin B-12 (CYANOCOBALAMIN) 100 MCG tablet Take 100 mcg by mouth daily.   06/14/2016 at Unknown time    Treatment Modalities: Medication Management, Group therapy, Case management,  1 to 1 session with clinician, Psychoeducation, Recreational therapy.   Physician Treatment Plan for Primary Diagnosis: Bipolar disorder, curr episode depressed, severe, w/psychotic features (HCC) Long Term Goal(s): Improvement in symptoms so as ready for discharge   Short Term Goals: Ability to identify changes in lifestyle to reduce recurrence of condition will improve, Ability to identify and develop effective coping behaviors will improve and Compliance with  prescribed medications will improve  Medication Management: Evaluate patient's response, side effects, and tolerance of medication regimen.  Therapeutic Interventions: 1 to 1 sessions, Unit Group sessions and Medication administration.  Evaluation of Outcomes: Progressing   Physician Treatment Plan for Secondary Diagnosis: Principal Problem:   Bipolar disorder, curr episode depressed, severe, w/psychotic features (HCC) Active Problems:   PTSD (post-traumatic stress disorder)   Opioid use disorder, mild, on maintenance therapy   Cocaine use disorder, mild, abuse  Long Term Goal(s): Improvement in symptoms so as ready for discharge  Short Term Goals: Ability to identify changes in lifestyle to reduce recurrence of condition will improve, Ability to identify and develop effective coping behaviors will improve and Compliance with prescribed medications will improve  Medication Management: Evaluate patient's response, side effects, and tolerance of medication regimen.  Therapeutic Interventions: 1 to 1 sessions, Unit Group sessions and Medication administration.  Evaluation of Outcomes: Progressing    RN Treatment Plan for Primary Diagnosis: Bipolar disorder, curr episode depressed, severe, w/psychotic features (HCC) Long Term Goal(s): Knowledge of disease and therapeutic regimen to maintain health will improve  Short Term Goals: Ability to participate in decision making will improve, Ability to verbalize feelings will improve and Ability to identify and develop effective coping behaviors will improve  Medication Management: RN will administer medications as ordered by provider, will assess and evaluate patient's response and provide education to patient for prescribed medication. RN will report any adverse and/or side effects to prescribing provider.  Therapeutic Interventions: 1 on 1 counseling sessions, Psychoeducation, Medication administration, Evaluate responses to  treatment, Monitor vital signs and CBGs as ordered, Perform/monitor CIWA, COWS, AIMS and Fall Risk screenings as ordered, Perform wound care treatments as ordered.  Evaluation of Outcomes: Progressing    LCSW Treatment Plan for Primary Diagnosis: Bipolar disorder, curr episode depressed, severe, w/psychotic features (HCC) Long Term Goal(s): Safe transition to appropriate next level of care at discharge, Engage patient in therapeutic group addressing interpersonal concerns.  Short Term Goals: Engage patient in aftercare planning with referrals and resources, Increase social support and Increase skills for wellness and recovery  Therapeutic Interventions: Assess for all discharge needs, 1 to 1 time with Social worker, Explore available resources and support systems, Assess for adequacy in community support network, Educate family and significant other(s) on suicide prevention, Complete Psychosocial Assessment, Interpersonal group therapy.  Evaluation of Outcomes: Progressing    Progress in Treatment :  Attending groups: Yes  Participating in groups: Yes  Taking medication as prescribed: Yes, MD continuing to assess for appropriate medication regimen  Toleration medication: Yes  Family/Significant other contact made: Pt refused family contact   Patient understands diagnosis: Yes  Discussing patient identified problems/goals with staff: Yes  Medical problems stabilized or  resolved: Yes  Denies suicidal/homicidal ideation:No, contracts for safety  Issues/concerns per patient self-inventory: None reported  Other: N/A  New problem(s) identified: None reported at this time    New Short Term/Long Term Goal(s): None at this time    Discharge Plan or Barriers: CSW assessing appropriate referrals.     Reason for Continuation of Hospitalization: Anxiety Depression Medication stabilization Suicidal Ideations Withdrawal symptoms  Estimated Length of  Stay: D/C Monday     Attendees:  Patient:  Physician: Dr. Jama Flavors & Dr. Elna Breslow, MD   Nursing: Joslyn Devon, Midge Aver, RN  RN Care Manager:   Social Workers: Rondall Allegra, LCSWA, Chad Cordial, LCSW  Nurse Pratictioners: Gray Bernhardt, NP   Scribe for Treatment Team: Rondall Allegra MSW, [facility]  06/22/2016 5:16 PM

## 2016-06-22 NOTE — Progress Notes (Signed)
Pt back from ED with a diagnosis of L. arm pain. Per Azalia BilisKevin Campos, MD "This seems to be more of a muscular type issue" Pt was ambulatory coming unto the unit; states, "I feel better; the pain is about a 1 right now." Tests per ED nurse-Ann RN were unremarkable.

## 2016-06-22 NOTE — Progress Notes (Signed)
Patient ID: Keeva Reisen, female   DOB: 04-08-1956, 60 y.o.   MRN: [id number] Patient ID: Cherish Runde, female   DOB: 11-24-1955, 60 y.o.   MRN: [id number] Patient ID: Shantai Tiedeman, female   DOB: May 03, 1956, 60 y.o.   MRN: [id number] [facility] MD Progress Note  06/22/2016 1:40 PM Shemeca Lukasik  MRN:  [id number]  Subjective: Anicka states "I'm still feeling very depressed. I could not sleep last night. I had a throbbing pain to my left arm. I was sent to the ED. I don't feel like my mood is improving. I got bad situation affecting my mood"  Objective: Oluwademilade is a  60 year old AA female who was admitted to [facility] from the [facility] with complaints of worsening symptoms of depression & SI.  Patient seen and chart reviewed. Discussed patient with treatment team. Pt reports that she is depressed & does not want be here any more (meaning that she is feeling suicidal). She continues to present flat affect during this follow-up assessment as she states that she does not want to be discharged as she has no place to go or live after discharge. Pt continues to ruminate about her several stressors - relationship issues with daughter and lack of social support. She continues on the mood stabilizer Lamictal & other medication regimen without any adverse effects reported. Chai is noted to be participating in group sessions. She presents as cheerful & with good affect when interacting with the other patients. She appears to be in no apparent distress.   Principal Problem: Bipolar disorder, curr episode depressed, severe, w/psychotic features ([place]) Diagnosis:   Patient Active Problem List   Diagnosis Date Noted  . Bipolar disorder, curr episode depressed, severe, w/psychotic features ([place]) [F31.5] 06/18/2016    Priority: High  . Cocaine use disorder, mild, abuse [F14.10] 06/18/2016    Priority: Medium  . PTSD (post-traumatic stress disorder) [F43.10] 06/18/2016  . Opioid use disorder, mild, on maintenance therapy [F11.90]  06/18/2016   Total Time spent with patient: 15 minutes  Past Psychiatric History: Hx of bipolar disorder - pls see H&p.  Past Medical History:  Past Medical History:  Diagnosis Date  . Abdominal adhesions   . Anxiety   . Chronic abdominal pain   . Chronic back pain   . Hepatitis C   . Migraine     Past Surgical History:  Procedure Laterality Date  . ABDOMINAL HYSTERECTOMY    . APPENDECTOMY    . CHOLECYSTECTOMY    . GALLBLADDER SURGERY     Family History: Please see H&P.  Family Psychiatric  History: Please see H&P.  Social History: Please see H&P.  History  Alcohol Use No     History  Drug Use No    Social History   Social History  . Marital status: Divorced    Spouse name: N/A  . Number of children: N/A  . Years of education: N/A   Social History Main Topics  . Smoking status: Current Every Day Smoker    Packs/day: 0.50  . Smokeless tobacco: Never Used  . Alcohol use No  . Drug use: No  . Sexual activity: Not Asked   Other Topics Concern  . None   Social History Narrative   ** Merged History Encounter **       Additional Social History:   Sleep: Poor  Appetite:  Fair  Current Medications: Current Facility-Administered Medications  Medication Dose Route Frequency Provider Last Rate Last Dose  . acetaminophen (TYLENOL) tablet  650 mg  650 mg Oral Q6H PRN Laverle Hobby, PA-C   650 mg at 06/21/16 1126  . alum & mag hydroxide-simeth (MAALOX/MYLANTA) 200-200-20 MG/5ML suspension 30 mL  30 mL Oral Q4H PRN Shuvon B Rankin, NP      . benzonatate (TESSALON) capsule 200 mg  200 mg Oral TID PRN Laverle Hobby, PA-C   200 mg at 06/19/16 2054  . busPIRone (BUSPAR) tablet 10 mg  10 mg Oral TID Shuvon B Rankin, NP   10 mg at 06/22/16 1216  . cholecalciferol (VITAMIN D) tablet 2,000 Units  2,000 Units Oral Daily Jenne Campus, MD   2,000 Units at 06/22/16 [phone]  . feeding supplement (ENSURE ENLIVE) (ENSURE ENLIVE) liquid 237 mL  237 mL Oral BID BM Shuvon B  Rankin, NP   237 mL at 06/22/16 0759  . gabapentin (NEURONTIN) capsule 300 mg  300 mg Oral [id number] Encarnacion Slates, NP   300 mg at 06/22/16 0753  . guaiFENesin (MUCINEX) 12 hr tablet 1,200 mg  1,200 mg Oral BID PRN Laverle Hobby, PA-C   1,200 mg at 06/19/16 2054  . hydrOXYzine (ATARAX/VISTARIL) tablet 50 mg  50 mg Oral QHS,MR X 1 Laverle Hobby, PA-C   50 mg at 06/21/16 2144  . ipratropium-albuterol (DUONEB) 0.5-2.5 (3) MG/3ML nebulizer solution 3 mL  3 mL Nebulization Q6H Kerrie Buffalo, NP   3 mL at 06/22/16 0800  . lamoTRIgine (LAMICTAL) tablet 25 mg  25 mg Oral Daily Ursula Alert, MD   25 mg at 06/22/16 0753  . magnesium hydroxide (MILK OF MAGNESIA) suspension 30 mL  30 mL Oral Daily PRN Shuvon B Rankin, NP      . methadone (DOLOPHINE) tablet 67.5 mg  67.5 mg Oral Daily Shuvon B Rankin, NP   67.5 mg at 06/22/16 0754  . nicotine (NICODERM CQ - dosed in mg/24 hours) patch 21 mg  21 mg Transdermal Daily Jenne Campus, MD   21 mg at 06/22/16 0758  . PARoxetine (PAXIL) tablet 20 mg  20 mg Oral Daily Shuvon B Rankin, NP   20 mg at 06/22/16 0757  . potassium chloride SA (K-DUR,KLOR-CON) CR tablet 20 mEq  20 mEq Oral Daily Shuvon B Rankin, NP   20 mEq at 06/22/16 0757  . QUEtiapine (SEROQUEL) tablet 400 mg  400 mg Oral QHS Ursula Alert, MD   400 mg at 06/21/16 2143    Lab Results:  Results for orders placed or performed during the hospital encounter of 06/17/16 (from the past 48 hour(s))  CBC with Differential     Status: Abnormal   Collection Time: 06/21/16 11:57 PM  Result Value Ref Range   WBC 3.7 (L) 4.0 - 10.5 K/uL   RBC 3.79 (L) 3.87 - 5.11 MIL/uL   Hemoglobin 10.8 (L) 12.0 - 15.0 g/dL   HCT 34.5 (L) 36.0 - 46.0 %   MCV 91.0 78.0 - 100.0 fL   MCH 28.5 26.0 - 34.0 pg   MCHC 31.3 30.0 - 36.0 g/dL   RDW 15.2 11.5 - 15.5 %   Platelets 120 (L) 150 - 400 K/uL   Neutrophils Relative % 37 %   Neutro Abs 1.4 (L) 1.7 - 7.7 K/uL   Lymphocytes Relative 46 %   Lymphs Abs 1.7 0.7 - 4.0  K/uL   Monocytes Relative 12 %   Monocytes Absolute 0.4 0.1 - 1.0 K/uL   Eosinophils Relative 5 %   Eosinophils Absolute 0.2 0.0 - 0.7 K/uL  Basophils Relative 0 %   Basophils Absolute 0.0 0.0 - 0.1 K/uL  Comprehensive metabolic panel     Status: Abnormal   Collection Time: 06/21/16 11:57 PM  Result Value Ref Range   Sodium 137 135 - 145 mmol/L   Potassium 4.6 3.5 - 5.1 mmol/L   Chloride 103 101 - 111 mmol/L   CO2 27 22 - 32 mmol/L   Glucose, Bld 106 (H) 65 - 99 mg/dL   BUN 9 6 - 20 mg/dL   Creatinine, Ser 0.74 0.44 - 1.00 mg/dL   Calcium 9.4 8.9 - 10.3 mg/dL   Total Protein 7.0 6.5 - 8.1 g/dL   Albumin 3.5 3.5 - 5.0 g/dL   AST 33 15 - 41 U/L   ALT 20 14 - 54 U/L   Alkaline Phosphatase 66 38 - 126 U/L   Total Bilirubin 0.2 (L) 0.3 - 1.2 mg/dL   GFR calc non Af Amer >60 >60 mL/min   GFR calc Af Amer >60 >60 mL/min    Comment: (NOTE) The eGFR has been calculated using the CKD EPI equation. This calculation has not been validated in all clinical situations. eGFR's persistently <60 mL/min signify possible Chronic Kidney Disease.    Anion gap 7 5 - 15   Blood Alcohol level:  Lab Results  Component Value Date   ETH <5 06/15/2016   ETH <5 48/18/5631    Metabolic Disorder Labs: Lab Results  Component Value Date   HGBA1C 5.5 06/20/2016   MPG 111 06/20/2016   MPG 114 03/18/2016   Lab Results  Component Value Date   PROLACTIN 6.5 03/18/2016   Lab Results  Component Value Date   CHOL 177 06/20/2016   TRIG 124 06/20/2016   HDL 50 06/20/2016   CHOLHDL 3.5 06/20/2016   VLDL 25 06/20/2016   LDLCALC 102 (H) 06/20/2016   LDLCALC 99 03/18/2016   Physical Findings: AIMS: Facial and Oral Movements Muscles of Facial Expression: None, normal Lips and Perioral Area: None, normal Jaw: None, normal Tongue: None, normal,Extremity Movements Upper (arms, wrists, hands, fingers): None, normal Lower (legs, knees, ankles, toes): None, normal, Trunk Movements Neck, shoulders,  hips: None, normal, Overall Severity Severity of abnormal movements (highest score from questions above): None, normal Incapacitation due to abnormal movements: None, normal Patient's awareness of abnormal movements (rate only patient's report): No Awareness, Dental Status Current problems with teeth and/or dentures?: Yes Does patient usually wear dentures?: Yes  CIWA:    COWS:     Musculoskeletal: Strength & Muscle Tone: within normal limits Gait & Station: normal Patient leans: N/A  Psychiatric Specialty Exam: Physical Exam  Nursing note and vitals reviewed. Constitutional: She appears well-developed.  HENT:  Head: Normocephalic.  Eyes: Pupils are equal, round, and reactive to light.  Neck: Normal range of motion.  Cardiovascular: Normal rate.   Respiratory: Effort normal.  GI: Soft.  Genitourinary:  Genitourinary Comments: Denies any issues  Musculoskeletal: Normal range of motion.  Neurological: She is alert.  Skin: Skin is warm.    Review of Systems  Constitutional: Negative.  Negative for chills and fever.  HENT: Negative for congestion and sore throat.   Respiratory: Negative.   Gastrointestinal: Negative.   Genitourinary: Negative.   Musculoskeletal: Negative.   Skin: Negative.   Neurological: Positive for headaches.  Psychiatric/Behavioral: Positive for depression, substance abuse (Hx Cocaine abuse) and suicidal ideas. Negative for hallucinations and memory loss. The patient is nervous/anxious and has insomnia ("Improving").   All other systems reviewed and are  negative.   Blood pressure 108/69, pulse 90, temperature 98.2 F (36.8 C), temperature source Oral, resp. rate 16, height '5\' 8"'$  (1.727 m), weight 82.1 kg (181 lb), SpO2 99 %.Body mass index is 27.52 kg/m.  General Appearance: Disheveled  Eye Contact:  Fair  Speech:  Normal Rate  Volume:  Decreased  Mood:  Anxious, Depressed and Dysphoric  Affect:  Depressed  Thought Process:  Goal Directed and  Descriptions of Associations: Circumstantial  Orientation:  Full (Time, Place, and Person)  Thought Content:  Paranoid Ideation and Rumination  Suicidal Thoughts:  Yes.  with intent/plan patient contracts for safety on the unit   Homicidal Thoughts:  No  Memory:  Immediate;   Fair Recent;   Fair Remote;   Fair  Judgement:  Fair  Insight:  Shallow  Psychomotor Activity:  Restlessness  Concentration:  Concentration: Poor and Attention Span: Fair  Recall:  [employer] of Knowledge:  Fair  Language:  Fair  Akathisia:  No  Handed:  Right  AIMS (if indicated):     Assets:  Desire for Improvement  ADL's:  Intact  Cognition:  WNL  Sleep:  Number of Hours: 5.75   Treatment Plan Summary: Corrinne is a  60 year old AA female who was admitted to [facility] from the [facility] with complaints of worsening symptoms of depression & SI. Patient today continues to be depressed and has sleep issues, continues to be suicidal, will continue treatment.  Daily contact with patient to assess and evaluate symptoms and progress in treatment and Medication management  Will continue Paxil 20 mg po daily for affective sx. Will continue Buspar 10 mg po tid for anxiety sx. Will continue Gabapentin 200 mg po bid for mood sx. Will continue Lamictal 25 mg po daily for bipolar do sx. Will continue Seroquel 400 mg po qhs for psychosis, mood, sleep. Restarted home medications where indicated. Will continue symptomatic treatment for cough - NP Freda Munro May to assess patient today. Reviewed past medical records,treatment plan.  Will continue to monitor vitals ,medication compliance and treatment side effects while patient is here.  Will monitor for medical issues as well as call consult as needed.  Reviewed labs ,will order tsh, lipid panel, hba1c. CSW will continue working on disposition.  Patient to participate in therapeutic milieu .  No changes made the current treatment plan.  Lindell Spar I, NP, PMHNP,  FNP-BC 06/22/2016, 1:40 PM

## 2016-06-22 NOTE — Progress Notes (Signed)
Recreation Therapy Notes  Date: 06/22/16 Time: 0930 Location: 300 Hall Dayroom  Group Topic: Stress Management  Goal Area(s) Addresses:  Patient will verbalize importance of using healthy stress management.  Patient will identify positive emotions associated with healthy stress management.   Intervention: Stress Management  Activity :  Guided Imagery.  LRT introduced patients to the technique of guided imagery.  LRT read script to allow patients to participate in the technique.  Patients were to listen and follow along as LRT read script.  Education:  Stress Management, Discharge Planning.   Education Outcome: Acknowledges edcuation/In group clarification offered/Needs additional education  Clinical Observations/Feedback:  Pt did not attend group.   Caroll RancherMarjette Tazia Illescas, LRT/CTRS         Caroll RancherLindsay, Briseida Gittings A 06/22/2016 12:19 PM

## 2016-06-22 NOTE — Progress Notes (Signed)
D: Patient's self inventory sheet: patient has poor sleep, recieved sleep medication.poor  Appetite, low energy level, poor concentration. Rated depression 9/10, hopeless 8/10, anxiety 7/10. SI/HI/AVH: Reports continued suicidal ideation almost all the time . Physical complaints are headaches and hip pain. Goal is "try to eat better". Plans to work on "drink less coffee".   A: Medications administered, assessed medication knowledge and education given on medication regimen.  Emotional support and encouragement given patient. R: endorses SI and denies HI , contracts for safety. Safety maintained with 15 minute checks.

## 2016-06-22 NOTE — ED Provider Notes (Signed)
[facility]-EMERGENCY DEPT Provider Note   CSN: [id number] Arrival date & time: 06/21/16  2319     History   Chief Complaint Chief Complaint  Patient presents with  . Arm Pain    HPI Sydney Gonzales is a 60 y.o. female.  HPI Patient is currently an inpatient at [facility] behavioral [facility] where she is being managed for depression and suicidal thoughts.  She was sent to the emergency department tonight from the inpatient facility after developing some left arm and shoulder pain.  Patient reports that her left arm and shoulder pain is mild in severity and worse with palpation and movement.  She denies numbness or tingling in her left upper extremity.  She reports no neck pain.  No recent injury or trauma.  She denies chest pain or chest tightness.  States that it feels like the pain is now radiating in towards her left axillary region.   Past Medical History:  Diagnosis Date  . Abdominal adhesions   . Anxiety   . Chronic abdominal pain   . Chronic back pain   . Hepatitis C   . Migraine     Patient Active Problem List   Diagnosis Date Noted  . Bipolar disorder, curr episode depressed, severe, w/psychotic features ([facility]) 06/18/2016  . PTSD (post-traumatic stress disorder) 06/18/2016  . Opioid use disorder, mild, on maintenance therapy 06/18/2016  . Cocaine use disorder, mild, abuse 06/18/2016    Past Surgical History:  Procedure Laterality Date  . ABDOMINAL HYSTERECTOMY    . APPENDECTOMY    . CHOLECYSTECTOMY    . GALLBLADDER SURGERY      OB History    No data available       Home Medications    Prior to Admission medications   Medication Sig Start Date End Date Taking? Authorizing Provider  busPIRone (BUSPAR) 10 MG tablet Take 1 tablet (10 mg total) by mouth 3 (three) times daily. 03/21/16   Adonis Brook, NP  Cholecalciferol (VITAMIN D3) 2000 units capsule Take 2,000 Units by mouth daily.    Historical Provider, MD  dicyclomine (BENTYL) 20 MG tablet Take 1 tablet (20 mg  total) by mouth 2 (two) times daily as needed for spasms. Patient not taking: Reported on 06/15/2016 06/03/16   Raeford Razor, MD  methadone (DOLOPHINE) 10 MG/ML solution Take 67.5 mg by mouth daily.     Historical Provider, MD  PARoxetine (PAXIL) 20 MG tablet Take 1 tablet (20 mg total) by mouth daily. 03/21/16   Adonis Brook, NP  potassium chloride SA (K-DUR,KLOR-CON) 20 MEQ tablet Take 1 tablet (20 mEq total) by mouth daily. 04/28/16   Rolan Bucco, MD  QUEtiapine (SEROQUEL) 200 MG tablet Take 200 mg by mouth at bedtime.    Historical Provider, MD  vitamin B-12 (CYANOCOBALAMIN) 100 MCG tablet Take 100 mcg by mouth daily.    Historical Provider, MD    Family History History reviewed. No pertinent family history.  Social History Social History  Substance Use Topics  . Smoking status: Current Every Day Smoker    Packs/day: 0.50  . Smokeless tobacco: Never Used  . Alcohol use No     Allergies   Shellfish allergy; Stadol [butorphanol tartrate]; Talwin [pentazocine]; Toradol [ketorolac tromethamine]; Ivp dye [iodinated diagnostic agents]; Morphine and related; and Nalbuphine   Review of Systems Review of Systems  All other systems reviewed and are negative.    Physical Exam Updated Vital Signs BP 105/62   Pulse 67   Temp 98.1 F (36.7 C)  Resp 18   Ht 5\' 8"  (1.727 m)   Wt 181 lb (82.1 kg)   SpO2 99%   BMI 27.52 kg/m   Physical Exam  Constitutional: She is oriented to person, place, and time. She appears well-developed and well-nourished.  HENT:  Head: Normocephalic.  Eyes: EOM are normal.  Neck: Normal range of motion. Neck supple.  No C-spine tenderness  Pulmonary/Chest: Effort normal.  Abdominal: She exhibits no distension.  Musculoskeletal:  Full range of motion of left elbow and left shoulder.  Normal left radial pulse.  No swelling of the left upper extremity as compared to the right.  No palpable mass or nodes noted in the left axillary region.  No erythema or  overlying skin changes of the left upper extremity.  Neurological: She is alert and oriented to person, place, and time.  Psychiatric: She has a normal mood and affect.  Nursing note and vitals reviewed.    ED Treatments / Results  Labs (all labs ordered are listed, but only abnormal results are displayed) Labs Reviewed  LIPID PANEL - Abnormal; Notable for the following:       Result Value   LDL Cholesterol 102 (*)    All other components within normal limits  CBC WITH DIFFERENTIAL/PLATELET - Abnormal; Notable for the following:    WBC 3.7 (*)    RBC 3.79 (*)    Hemoglobin 10.8 (*)    HCT 34.5 (*)    Platelets 120 (*)    Neutro Abs 1.4 (*)    All other components within normal limits  COMPREHENSIVE METABOLIC PANEL - Abnormal; Notable for the following:    Glucose, Bld 106 (*)    Total Bilirubin 0.2 (*)    All other components within normal limits  TSH  HEMOGLOBIN A1C  URINALYSIS, ROUTINE W REFLEX MICROSCOPIC (NOT AT [facility])    EKG  EKG Interpretation  Date/Time:  Thursday June 21 2016 23:52:46 EDT Ventricular Rate:  79 PR Interval:  142 QRS Duration: 86 QT Interval:  410 QTC Calculation: 470 R Axis:   26 Text Interpretation:  Normal sinus rhythm Cannot rule out Inferior infarct , age undetermined Abnormal ECG No significant change was found Confirmed by Auburn Hert  MD, Caryn Bee ([id number]) on 06/22/2016 4:25:40 AM       Radiology No results found.  Procedures Procedures (including critical care time)  Medications Ordered in ED Medications  alum & mag hydroxide-simeth (MAALOX/MYLANTA) 200-200-20 MG/5ML suspension 30 mL (not administered)  magnesium hydroxide (MILK OF MAGNESIA) suspension 30 mL (not administered)  busPIRone (BUSPAR) tablet 10 mg (10 mg Oral Given 06/21/16 1652)  PARoxetine (PAXIL) tablet 20 mg (20 mg Oral Given 06/21/16 0813)  potassium chloride SA (K-DUR,KLOR-CON) CR tablet 20 mEq (20 mEq Oral Given 06/21/16 0813)  cholecalciferol (VITAMIN D) tablet 2,000  Units (2,000 Units Oral Given 06/21/16 0813)  methadone (DOLOPHINE) tablet 67.5 mg (67.5 mg Oral Given 06/21/16 0812)  feeding supplement (ENSURE ENLIVE) (ENSURE ENLIVE) liquid 237 mL (237 mLs Oral Given 06/21/16 1400)  hydrOXYzine (ATARAX/VISTARIL) tablet 50 mg (50 mg Oral Not Given 06/21/16 2300)  benzonatate (TESSALON) capsule 200 mg (200 mg Oral Given 06/19/16 2054)  guaiFENesin (MUCINEX) 12 hr tablet 1,200 mg (1,200 mg Oral Given 06/19/16 2054)  nicotine (NICODERM CQ - dosed in mg/24 hours) patch 21 mg (21 mg Transdermal Patch Applied 06/21/16 0814)  QUEtiapine (SEROQUEL) tablet 400 mg (400 mg Oral Given 06/21/16 2143)  lamoTRIgine (LAMICTAL) tablet 25 mg (25 mg Oral Given 06/21/16 0813)  ipratropium-albuterol (DUONEB)  0.5-2.5 (3) MG/3ML nebulizer solution 3 mL (3 mLs Nebulization Given 06/21/16 2000)  acetaminophen (TYLENOL) tablet 650 mg (650 mg Oral Given 06/21/16 1126)  gabapentin (NEURONTIN) capsule 300 mg (300 mg Oral Given 06/21/16 2144)  acetaminophen (TYLENOL) tablet 650 mg (not administered)     Initial Impression / Assessment and Plan / ED Course  I have reviewed the triage vital signs and the nursing notes.  Pertinent labs & imaging results that were available during my care of the patient were reviewed by me and considered in my medical decision making (see chart for details).  Clinical Course    Overall well-appearing.  This seems to be more of a muscular type issue.  Tylenol given for pain.  Doubt DVT.  No signs to suggest arterial insufficiency.  Doubt atypical presentation of ACS.  No indication for imaging.  Primary care follow-up.  Patient be discharged from the emergency department and taken back to [facility]  Final Clinical Impressions(s) / ED Diagnoses   Final diagnoses:  Left arm pain    New Prescriptions New Prescriptions   No medications on file     Azalia BilisKevin Andry Bogden, MD 06/22/16 [phone]

## 2016-06-22 NOTE — ED Notes (Signed)
Updated on plan, process and wait with rationale. Encouraged and reassured. Pt alert, NAD, calm, interactive, resps e/u, no dyspnea noted. Steady gait.

## 2016-06-22 NOTE — [facility] Group Notes (Signed)
[facility] LCSW Group Therapy  06/22/2016 4:36 PM  Type of Therapy:  Group Therapy  Participation Level:  Did Not Attend  Modes of Intervention:  Activity, Discussion, Education, Socialization and Support  Summary of Progress/Problems: Feelings around Relapse. Group members discussed the meaning of relapse and shared personal stories of relapse, how it affected them and others, and how they perceived themselves during this time. Group members were encouraged to identify triggers, warning signs and coping skills used when facing the possibility of relapse. Social supports were discussed and explored in detail.   Sydney Gonzales L [name] MSW, LCSWA  06/22/2016, 4:36 PM  

## 2016-06-22 NOTE — Progress Notes (Signed)
Patient ID: Beryle LatheCarol Gonzales, female   DOB: 1956/05/08, 60 y.o.   MRN: [id number]  CSW spoke with Wes, Lead at [facility]. He is concerned if patient was discharged over the weekend. They provide case management and are a great support for patient.  Case managers will not be available over the weekend. They suggested [facility] transitional housing program as a potential housing option. Currently, [facility] has a waiting list for the transitional housing program and she will need transportation to [facility] if she wanted to continue her Methadone treatment. Patient states [facility] because she does not have transportation to [facility] on the weekend due to lack of buses. However, she states she is [place] or in [place] with her mother. Pt agreed to speak with daughter this afternoon. Pt still endorsing SI. CSW assessing for appropriate referrals.   Sydney Gonzales MSW, LCSWA  06/22/2016 3:39 PM

## 2016-06-22 NOTE — Progress Notes (Signed)
The patient attended this evening's A. A. Meeting and was appropriate.  

## 2016-06-23 MED ORDER — LOPERAMIDE HCL 2 MG PO CAPS
2.0000 mg | ORAL_CAPSULE | ORAL | Status: DC | PRN
  Administered 2016-06-23 – 2016-06-24 (×2): 2 mg via ORAL
  Filled 2016-06-23 (×2): qty 1

## 2016-06-23 MED ORDER — ZINC OXIDE 40 % EX OINT
TOPICAL_OINTMENT | Freq: Three times a day (TID) | CUTANEOUS | Status: DC | PRN
  Filled 2016-06-23: qty 114

## 2016-06-23 MED ORDER — IPRATROPIUM-ALBUTEROL 0.5-2.5 (3) MG/3ML IN SOLN
3.0000 mL | Freq: Four times a day (QID) | RESPIRATORY_TRACT | Status: DC | PRN
  Administered 2016-06-23 – 2016-06-25 (×3): 3 mL via RESPIRATORY_TRACT
  Filled 2016-06-23 (×2): qty 3

## 2016-06-23 NOTE — Progress Notes (Signed)
D: Patient's self inventory sheet: patient has poor sleep, recieved sleep medication without relief.poor  Appetite, low energy level, poor concentration. Rated depression 9/10, hopeless 10/10, anxiety 8/10. SI/HI/AVH: Endorses almost constant SI, reports taht she thinks about being dead all the time. Interactions with peers and staff are superficial and cheerful. Physical complaints are Right hip pain 4/10. Goal is "Staying positive". Plans to work on "Think positive thoughts, stay around positive ppl." Patient stated that she is homeless with no place to go upon discharge.   A: Medications administered, assessed medication knowledge and education given on medication regimen.  Emotional support and encouragement given patient. R: Continues to endorse persistent SI and denies HI , contracts for safety. Safety maintained with 15 minute checks.

## 2016-06-23 NOTE — Progress Notes (Signed)
Adult Psychoeducational Group Note  Date:  06/23/2016 Time:  9:20 PM  Group Topic/Focus:  Wrap-Up Group:   The focus of this group is to help patients review their daily goal of treatment and discuss progress on daily workbooks.   Participation Level:  Minimal  Participation Quality:  Attentive and Drowsy  Affect:  Appropriate  Cognitive:  Appropriate  Insight: Appropriate  Engagement in Group:  Engaged  Modes of Intervention:  Discussion  Additional Comments: Pt stated her goal today was to stay positive, which she was able to do. Pt stated she was able to remove herself from a negative situation. One positive thing today is that she was able to walk away from the negative situation earlier in the day.  Sydney CorwinOwen, Sydney Gonzales C 06/23/2016, 9:20 PM

## 2016-06-23 NOTE — [facility] Group Notes (Signed)
[facility] Group Notes:  (Nursing/Healthy Coping Skills)  Date:  06/23/2016  Time:  1000 Type of Therapy:  Nurse Education  Participation Level:  Minimal  Participation Quality:  Drowsy  Affect:  Appropriate  Cognitive:  Appropriate  Insight:  Limited  Engagement in Group:  Limited  Modes of Intervention:  Discussion, Education, Orientation and Support  Summary of Progress/Problems: Pt observed sleeping in group at intervals. Limited participation in group discussion related to drowsiness.  Sherryl MangesWesseh, Veena Sturgess 06/23/2016, 1000

## 2016-06-23 NOTE — Progress Notes (Signed)
D: Pt at the time of assessment endorsed moderate depression; states, "I am always depressed; I don't that is going anywhere" Pt also complained of moderate R. hip pain; states, "I think I lied on my R. side too long." Pt however, denied anxiety, SI, HI or AVH. Pt is flat, isolative and withdrawn to self even while in the dayroom. Pt remained calm and cooperative. A: Medications offered as prescribed.  Support, encouragement, and safe environment provided.  15-minute safety checks continue.  R: Pt was med compliant.  Pt attended AA group. Safety checks continue.

## 2016-06-23 NOTE — Progress Notes (Signed)
D.  Pt pleasant on approach, denies complaints at this time.  Positive for evening wrap up group, interacting appropriately with peers on the unit.  Denies SI/HI/hallucinations at this time.  A.  Support and encouragement offered, medication given as ordered.  R.  Pt remains safe on the unit, will continue to monitor. 

## 2016-06-23 NOTE — Progress Notes (Signed)
Patient ID: Sydney Gonzales, female   DOB: June 25, 1956, 60 y.o.   MRN: [id number] [facility] MD Progress Note  06/23/2016 4:03 PM Arpi Diebold  MRN:  [id number]  Subjective: [name] states "I'm still feeling very depressed. I could not sleep last night. I had a death wish last night, no thought to harm myself. I just dont want to be here any more. " Clarification was sought and patient "here on earth."   Objective: Sydney Gonzales is a  60 year old AA female who was admitted to [facility] from the [facility] with complaints of worsening symptoms of depression & SI.  Patient seen and chart reviewed. Discussed patient with treatment team. Pt reports that she is depressed & does not want be here any more (meaning that she is feeling suicidal). She continues to present flat affect during this follow-up assessment as she states that she does not want to be discharged as she has no place to go or live after discharge. Pt continues to ruminate about her several stressors - relationship issues with daughter and lack of social support. She continues on the mood stabilizer Lamictal & other medication regimen without any adverse effects reported. [name] is noted to be participating in group sessions. She presents as cheerful & with good affect when interacting with the other patients. She appears to be in no apparent distress.   Principal Problem: Bipolar disorder, curr episode depressed, severe, w/psychotic features ([place]) Diagnosis:   Patient Active Problem List   Diagnosis Date Noted  . Bipolar disorder, curr episode depressed, severe, w/psychotic features ([place]) [F31.5] 06/18/2016  . PTSD (post-traumatic stress disorder) [F43.10] 06/18/2016  . Opioid use disorder, mild, on maintenance therapy [F11.90] 06/18/2016  . Cocaine use disorder, mild, abuse [F14.10] 06/18/2016   Total Time spent with patient: 15 minutes  Past Psychiatric History: Hx of bipolar disorder - pls see H&p.  Past Medical History:  Past Medical History:   Diagnosis Date  . Abdominal adhesions   . Anxiety   . Chronic abdominal pain   . Chronic back pain   . Hepatitis C   . Migraine     Past Surgical History:  Procedure Laterality Date  . ABDOMINAL HYSTERECTOMY    . APPENDECTOMY    . CHOLECYSTECTOMY    . GALLBLADDER SURGERY     Family History: Please see H&P.  Family Psychiatric  History: Please see H&P.  Social History: Please see H&P.  History  Alcohol Use No     History  Drug Use No    Social History   Social History  . Marital status: Divorced    Spouse name: N/A  . Number of children: N/A  . Years of education: N/A   Social History Main Topics  . Smoking status: Current Every Day Smoker    Packs/day: 0.50  . Smokeless tobacco: Never Used  . Alcohol use No  . Drug use: No  . Sexual activity: Not Asked   Other Topics Concern  . None   Social History Narrative   ** Merged History Encounter **       Additional Social History:   Sleep: Poor  Appetite:  Fair  Current Medications: Current Facility-Administered Medications  Medication Dose Route Frequency Provider Last Rate Last Dose  . acetaminophen (TYLENOL) tablet 650 mg  650 mg Oral Q6H PRN Laverle Hobby, PA-C   650 mg at 06/22/16 2324  . alum & mag hydroxide-simeth (MAALOX/MYLANTA) 200-200-20 MG/5ML suspension 30 mL  30 mL Oral Q4H PRN Shuvon B  Rankin, NP   30 mL at 06/22/16 2021  . benzonatate (TESSALON) capsule 200 mg  200 mg Oral TID PRN Laverle Hobby, PA-C   200 mg at 06/19/16 2054  . busPIRone (BUSPAR) tablet 10 mg  10 mg Oral TID Shuvon B Rankin, NP   10 mg at 06/23/16 1243  . cholecalciferol (VITAMIN D) tablet 2,000 Units  2,000 Units Oral Daily Jenne Campus, MD   2,000 Units at 06/23/16 0743  . feeding supplement (ENSURE ENLIVE) (ENSURE ENLIVE) liquid 237 mL  237 mL Oral BID BM Shuvon B Rankin, NP   237 mL at 06/23/16 1343  . gabapentin (NEURONTIN) capsule 300 mg  300 mg Oral [id number] Encarnacion Slates, NP   300 mg at 06/23/16 0743  .  guaiFENesin (MUCINEX) 12 hr tablet 1,200 mg  1,200 mg Oral BID PRN Laverle Hobby, PA-C   1,200 mg at 06/19/16 2054  . hydrOXYzine (ATARAX/VISTARIL) tablet 50 mg  50 mg Oral QHS,MR X 1 Spencer E Simon, PA-C   50 mg at 06/23/16 0024  . ipratropium-albuterol (DUONEB) 0.5-2.5 (3) MG/3ML nebulizer solution 3 mL  3 mL Nebulization Q6H PRN Nanci Pina, FNP   3 mL at 06/23/16 1343  . lamoTRIgine (LAMICTAL) tablet 25 mg  25 mg Oral Daily Ursula Alert, MD   25 mg at 06/23/16 0743  . liver oil-zinc oxide (DESITIN) 40 % ointment   Topical TID PRN Nanci Pina, FNP      . loperamide (IMODIUM) capsule 2 mg  2 mg Oral PRN Nanci Pina, FNP   2 mg at 06/23/16 0935  . magnesium hydroxide (MILK OF MAGNESIA) suspension 30 mL  30 mL Oral Daily PRN Shuvon B Rankin, NP      . methadone (DOLOPHINE) tablet 67.5 mg  67.5 mg Oral Daily Shuvon B Rankin, NP   67.5 mg at 06/23/16 0742  . nicotine (NICODERM CQ - dosed in mg/24 hours) patch 21 mg  21 mg Transdermal Daily Jenne Campus, MD   21 mg at 06/23/16 0747  . PARoxetine (PAXIL) tablet 20 mg  20 mg Oral Daily Shuvon B Rankin, NP   20 mg at 06/23/16 0748  . potassium chloride SA (K-DUR,KLOR-CON) CR tablet 20 mEq  20 mEq Oral Daily Shuvon B Rankin, NP   20 mEq at 06/23/16 0743  . QUEtiapine (SEROQUEL) tablet 400 mg  400 mg Oral QHS Ursula Alert, MD   400 mg at 06/22/16 2122    Lab Results:  Results for orders placed or performed during the hospital encounter of 06/17/16 (from the past 48 hour(s))  CBC with Differential     Status: Abnormal   Collection Time: 06/21/16 11:57 PM  Result Value Ref Range   WBC 3.7 (L) 4.0 - 10.5 K/uL   RBC 3.79 (L) 3.87 - 5.11 MIL/uL   Hemoglobin 10.8 (L) 12.0 - 15.0 g/dL   HCT 34.5 (L) 36.0 - 46.0 %   MCV 91.0 78.0 - 100.0 fL   MCH 28.5 26.0 - 34.0 pg   MCHC 31.3 30.0 - 36.0 g/dL   RDW 15.2 11.5 - 15.5 %   Platelets 120 (L) 150 - 400 K/uL   Neutrophils Relative % 37 %   Neutro Abs 1.4 (L) 1.7 - 7.7 K/uL    Lymphocytes Relative 46 %   Lymphs Abs 1.7 0.7 - 4.0 K/uL   Monocytes Relative 12 %   Monocytes Absolute 0.4 0.1 - 1.0 K/uL   Eosinophils Relative 5 %  Eosinophils Absolute 0.2 0.0 - 0.7 K/uL   Basophils Relative 0 %   Basophils Absolute 0.0 0.0 - 0.1 K/uL  Comprehensive metabolic panel     Status: Abnormal   Collection Time: 06/21/16 11:57 PM  Result Value Ref Range   Sodium 137 135 - 145 mmol/L   Potassium 4.6 3.5 - 5.1 mmol/L   Chloride 103 101 - 111 mmol/L   CO2 27 22 - 32 mmol/L   Glucose, Bld 106 (H) 65 - 99 mg/dL   BUN 9 6 - 20 mg/dL   Creatinine, Ser 0.74 0.44 - 1.00 mg/dL   Calcium 9.4 8.9 - 10.3 mg/dL   Total Protein 7.0 6.5 - 8.1 g/dL   Albumin 3.5 3.5 - 5.0 g/dL   AST 33 15 - 41 U/L   ALT 20 14 - 54 U/L   Alkaline Phosphatase 66 38 - 126 U/L   Total Bilirubin 0.2 (L) 0.3 - 1.2 mg/dL   GFR calc non Af Amer >60 >60 mL/min   GFR calc Af Amer >60 >60 mL/min    Comment: (NOTE) The eGFR has been calculated using the CKD EPI equation. This calculation has not been validated in all clinical situations. eGFR's persistently <60 mL/min signify possible Chronic Kidney Disease.    Anion gap 7 5 - 15   Blood Alcohol level:  Lab Results  Component Value Date   ETH <5 06/15/2016   ETH <5 42/59/5638    Metabolic Disorder Labs: Lab Results  Component Value Date   HGBA1C 5.5 06/20/2016   MPG 111 06/20/2016   MPG 114 03/18/2016   Lab Results  Component Value Date   PROLACTIN 6.5 03/18/2016   Lab Results  Component Value Date   CHOL 177 06/20/2016   TRIG 124 06/20/2016   HDL 50 06/20/2016   CHOLHDL 3.5 06/20/2016   VLDL 25 06/20/2016   LDLCALC 102 (H) 06/20/2016   LDLCALC 99 03/18/2016   Physical Findings: AIMS: Facial and Oral Movements Muscles of Facial Expression: None, normal Lips and Perioral Area: None, normal Jaw: None, normal Tongue: None, normal,Extremity Movements Upper (arms, wrists, hands, fingers): None, normal Lower (legs, knees, ankles,  toes): None, normal, Trunk Movements Neck, shoulders, hips: None, normal, Overall Severity Severity of abnormal movements (highest score from questions above): None, normal Incapacitation due to abnormal movements: None, normal Patient's awareness of abnormal movements (rate only patient's report): No Awareness, Dental Status Current problems with teeth and/or dentures?: Yes Does patient usually wear dentures?: Yes  CIWA:    COWS:     Musculoskeletal: Strength & Muscle Tone: within normal limits Gait & Station: normal Patient leans: N/A  Psychiatric Specialty Exam: Physical Exam  Nursing note and vitals reviewed. Constitutional: She appears well-developed.  HENT:  Head: Normocephalic.  Eyes: Pupils are equal, round, and reactive to light.  Neck: Normal range of motion.  Cardiovascular: Normal rate.   Respiratory: Effort normal.  GI: Soft.  Genitourinary:  Genitourinary Comments: Denies any issues  Musculoskeletal: Normal range of motion.  Neurological: She is alert.  Skin: Skin is warm.    Review of Systems  Constitutional: Negative.  Negative for chills and fever.  HENT: Negative for congestion and sore throat.   Respiratory: Positive for cough.   Gastrointestinal: Negative.   Genitourinary: Negative.   Musculoskeletal: Negative.   Skin: Negative.   Neurological: Positive for headaches.  Psychiatric/Behavioral: Positive for depression, substance abuse (Hx Cocaine abuse) and suicidal ideas. Negative for hallucinations and memory loss. The patient is nervous/anxious and  has insomnia ("Improving").   All other systems reviewed and are negative.   Blood pressure (!) 79/54, pulse (!) 101, temperature 98.8 F (37.1 C), temperature source Oral, resp. rate 18, height '5\' 8"'$  (1.727 m), weight 82.1 kg (181 lb), SpO2 99 %.Body mass index is 27.52 kg/m.  General Appearance: Disheveled  Eye Contact:  Fair  Speech:  Normal Rate  Volume:  Decreased  Mood:  Anxious, Depressed and  Dysphoric  Affect:  Depressed  Thought Process:  Goal Directed and Descriptions of Associations: Circumstantial  Orientation:  Full (Time, Place, and Person)  Thought Content:  Paranoid Ideation and Rumination  Suicidal Thoughts:  Yes.  with intent/plan patient contracts for safety on the unit   Homicidal Thoughts:  No  Memory:  Immediate;   Fair Recent;   Fair Remote;   Fair  Judgement:  Fair  Insight:  Shallow  Psychomotor Activity:  Restlessness  Concentration:  Concentration: Poor and Attention Span: Fair  Recall:  [employer] of Knowledge:  Fair  Language:  Fair  Akathisia:  No  Handed:  Right  AIMS (if indicated):     Assets:  Desire for Improvement  ADL's:  Intact  Cognition:  WNL  Sleep:  Number of Hours: 5   Treatment Plan Summary: Yarisa is a  60 year old AA female who was admitted to [facility] from the [facility] with complaints of worsening symptoms of depression & SI. Patient today continues to be depressed and has sleep issues, continues to be suicidal, will continue treatment.  Daily contact with patient to assess and evaluate symptoms and progress in treatment and Medication management  Will continue Paxil 20 mg po daily for affective sx. Will continue Buspar 10 mg po tid for anxiety sx. Will continue Gabapentin 200 mg po bid for mood sx. Will continue Lamictal 25 mg po daily for bipolar do sx. Will continue Seroquel 400 mg po qhs for psychosis, mood, sleep.  Will increase vital signs if patient BP remains hypotensive. SHe is asymptomatic at this time.  Restarted home medications where indicated. Will continue symptomatic treatment for cough.  Reviewed past medical records,treatment plan.  Will continue to monitor vitals ,medication compliance and treatment side effects while patient is here.  Will monitor for medical issues as well as call consult as needed.  Reviewed labs ,will order tsh, lipid panel, hba1c. CSW will continue working on disposition.   Patient to participate in therapeutic milieu .  No changes made the current treatment plan.  Nanci Pina, FNP,  06/23/2016, 4:03 PM   Reviewed the information documented and agree with the treatment plan.  Akirra Lacerda 06/23/2016 4:15 PM
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
## 2016-06-24 NOTE — Progress Notes (Signed)
Patient stated she was feeling depressed and anxious this am. Denies nausea and itching.  Patient stated her depression was an 8, anxiety an 8, and hopelessness a 9. Patient endorses passive SI, denies HI or AVH.   Patient maintains safety through q 15 min monitoring.  Patient offered support, education, and encouragement. Medications were administered.  Patient is receptive and compliant, will continue to monitor.

## 2016-06-24 NOTE — Progress Notes (Signed)
Pt attended the evening AA speaker meeting. Michale Emmerich C, NT  06/24/16 8:32 PM 

## 2016-06-24 NOTE — Progress Notes (Signed)
Patient ID: Sydney Gonzales, female   DOB: [birth year], 60 y.o.   MRN: [id number] [facility] MD Progress Note  06/24/2016 2:10 PM Alexxus Sobh  MRN:  [id number]  Subjective: Sydney Gonzales states "I took my hair down, but still confused a little bit. I am still having those thoughts, aint nothing going to change. "   Objective: Sydney Gonzales is a  60 year old AA female who was admitted to [facility] from the [facility] with complaints of worsening symptoms of depression & SI.  Patient seen and chart reviewed. Discussed patient with treatment team. Pt reports that she is depressed & and continues to endorse passive suicidal ideations. Her statements do not reflect her behavior that is exhibited on the unit. She is observed interacting in the milieu and being very stern and straight forward with a patient on the unit who said something about her. " I am 60 years old and I dont need your help getting ready, I been doing this by myself. Let me tell you something about me." She continues to present flat affect during this follow-up assessment as she states that nothing is going to change and she has been feeling this way for a long time. She continues on the mood stabilizer Lamictal & other medication regimen without any adverse effects reported. Sydney Gonzales is noted to be participating in group sessions. She presents as cheerful & with good affect when interacting with the other patients. She appears to be in no apparent distress.   Principal Problem: Bipolar disorder, curr episode depressed, severe, w/psychotic features (HCC) Diagnosis:   Patient Active Problem List   Diagnosis Date Noted  . Bipolar disorder, curr episode depressed, severe, w/psychotic features (HCC) [F31.5] 06/18/2016  . PTSD (post-traumatic stress disorder) [F43.10] 06/18/2016  . Opioid use disorder, mild, on maintenance therapy [F11.90] 06/18/2016  . Cocaine use disorder, mild, abuse [F14.10] 06/18/2016   Total Time spent with patient: 15 minutes  Past Psychiatric  History: Hx of bipolar disorder - pls see H&p.  Past Medical History:  Past Medical History:  Diagnosis Date  . Abdominal adhesions   . Anxiety   . Chronic abdominal pain   . Chronic back pain   . Hepatitis C   . Migraine     Past Surgical History:  Procedure Laterality Date  . ABDOMINAL HYSTERECTOMY    . APPENDECTOMY    . CHOLECYSTECTOMY    . GALLBLADDER SURGERY     Family History: Please see H&P.  Family Psychiatric  History: Please see H&P.  Social History: Please see H&P.  History  Alcohol Use No     History  Drug Use No    Social History   Social History  . Marital status: Divorced    Spouse name: N/A  . Number of children: N/A  . Years of education: N/A   Social History Main Topics  . Smoking status: Current Every Day Smoker    Packs/day: 0.50  . Smokeless tobacco: Never Used  . Alcohol use No  . Drug use: No  . Sexual activity: Not Asked   Other Topics Concern  . None   Social History Narrative   ** Merged History Encounter **       Additional Social History:   Sleep: Fair  Appetite:  Fair  Current Medications: Current Facility-Administered Medications  Medication Dose Route Frequency Provider Last Rate Last Dose  . acetaminophen (TYLENOL) tablet 650 mg  650 mg Oral Q6H PRN Kerry Hough, PA-C   650 mg at 06/23/16  1610  . alum & mag hydroxide-simeth (MAALOX/MYLANTA) 200-200-20 MG/5ML suspension 30 mL  30 mL Oral Q4H PRN Shuvon B Rankin, NP   30 mL at 06/22/16 2021  . benzonatate (TESSALON) capsule 200 mg  200 mg Oral TID PRN Kerry Hough, PA-C   200 mg at 06/24/16 0111  . busPIRone (BUSPAR) tablet 10 mg  10 mg Oral TID Shuvon B Rankin, NP   10 mg at 06/24/16 1400  . cholecalciferol (VITAMIN D) tablet 2,000 Units  2,000 Units Oral Daily Craige Cotta, MD   2,000 Units at 06/24/16 [phone]  . feeding supplement (ENSURE ENLIVE) (ENSURE ENLIVE) liquid 237 mL  237 mL Oral BID BM Shuvon B Rankin, NP   237 mL at 06/24/16 1401  . gabapentin  (NEURONTIN) capsule 300 mg  300 mg Oral [id number] Sanjuana Kava, NP   300 mg at 06/24/16 1401  . guaiFENesin (MUCINEX) 12 hr tablet 1,200 mg  1,200 mg Oral BID PRN Kerry Hough, PA-C   1,200 mg at 06/24/16 0111  . hydrOXYzine (ATARAX/VISTARIL) tablet 50 mg  50 mg Oral QHS,MR X 1 Spencer E Simon, PA-C   50 mg at 06/23/16 2319  . ipratropium-albuterol (DUONEB) 0.5-2.5 (3) MG/3ML nebulizer solution 3 mL  3 mL Nebulization Q6H PRN Truman Hayward, FNP   3 mL at 06/23/16 2304  . lamoTRIgine (LAMICTAL) tablet 25 mg  25 mg Oral Daily Jomarie Longs, MD   25 mg at 06/24/16 9604  . liver oil-zinc oxide (DESITIN) 40 % ointment   Topical TID PRN Truman Hayward, FNP      . loperamide (IMODIUM) capsule 2 mg  2 mg Oral PRN Truman Hayward, FNP   2 mg at 06/24/16 0545  . magnesium hydroxide (MILK OF MAGNESIA) suspension 30 mL  30 mL Oral Daily PRN Shuvon B Rankin, NP      . methadone (DOLOPHINE) tablet 67.5 mg  67.5 mg Oral Daily Shuvon B Rankin, NP   67.5 mg at 06/24/16 0838  . nicotine (NICODERM CQ - dosed in mg/24 hours) patch 21 mg  21 mg Transdermal Daily Craige Cotta, MD   21 mg at 06/24/16 0839  . PARoxetine (PAXIL) tablet 20 mg  20 mg Oral Daily Shuvon B Rankin, NP   20 mg at 06/24/16 0837  . potassium chloride SA (K-DUR,KLOR-CON) CR tablet 20 mEq  20 mEq Oral Daily Shuvon B Rankin, NP   20 mEq at 06/24/16 0838  . QUEtiapine (SEROQUEL) tablet 400 mg  400 mg Oral QHS Jomarie Longs, MD   400 mg at 06/23/16 2055    Lab Results:  No results found for this or any previous visit (from the past 48 hour(s)). Blood Alcohol level:  Lab Results  Component Value Date   ETH <5 06/15/2016   ETH <5 06/14/2016    Metabolic Disorder Labs: Lab Results  Component Value Date   HGBA1C 5.5 06/20/2016   MPG 111 06/20/2016   MPG 114 03/18/2016   Lab Results  Component Value Date   PROLACTIN 6.5 03/18/2016   Lab Results  Component Value Date   CHOL 177 06/20/2016   TRIG 124 06/20/2016   HDL 50  06/20/2016   CHOLHDL 3.5 06/20/2016   VLDL 25 06/20/2016   LDLCALC 102 (H) 06/20/2016   LDLCALC 99 03/18/2016   Physical Findings: AIMS: Facial and Oral Movements Muscles of Facial Expression: None, normal Lips and Perioral Area: None, normal Jaw: None, normal Tongue: None, normal,Extremity Movements Upper (  arms, wrists, hands, fingers): None, normal Lower (legs, knees, ankles, toes): None, normal, Trunk Movements Neck, shoulders, hips: None, normal, Overall Severity Severity of abnormal movements (highest score from questions above): None, normal Incapacitation due to abnormal movements: None, normal Patient's awareness of abnormal movements (rate only patient's report): No Awareness, Dental Status Current problems with teeth and/or dentures?: Yes Does patient usually wear dentures?: Yes  CIWA:    COWS:     Musculoskeletal: Strength & Muscle Tone: within normal limits Gait & Station: normal Patient leans: N/A  Psychiatric Specialty Exam: Physical Exam  Nursing note and vitals reviewed. Constitutional: She appears well-developed.  HENT:  Head: Normocephalic.  Eyes: Pupils are equal, round, and reactive to light.  Neck: Normal range of motion.  Cardiovascular: Normal rate.   Respiratory: Effort normal.  GI: Soft.  Genitourinary:  Genitourinary Comments: Denies any issues  Musculoskeletal: Normal range of motion.  Neurological: She is alert.  Skin: Skin is warm.    Review of Systems  Constitutional: Negative.  Negative for chills and fever.  HENT: Negative for congestion and sore throat.   Respiratory: Positive for cough.   Gastrointestinal: Negative.   Genitourinary: Negative.   Musculoskeletal: Negative.   Skin: Negative.   Neurological: Positive for headaches.  Psychiatric/Behavioral: Positive for depression, substance abuse (Hx Cocaine abuse) and suicidal ideas. Negative for hallucinations and memory loss. The patient is nervous/anxious and has insomnia  ("Improving").   All other systems reviewed and are negative.   Blood pressure 93/72, pulse 100, temperature 98.7 F (37.1 C), temperature source Oral, resp. rate 18, height 5\' 8"  (1.727 m), weight 82.1 kg (181 lb), SpO2 99 %.Body mass index is 27.52 kg/m.  General Appearance: Fairly Groomed  Eye Contact:  Fair  Speech:  Normal Rate  Volume:  Decreased  Mood:  Anxious and Depressed  Affect:  Depressed  Thought Process:  Goal Directed and Descriptions of Associations: Circumstantial  Orientation:  Full (Time, Place, and Person)  Thought Content:  Logical  Suicidal Thoughts:  Yes.  without intent/plan patient contracts for safety on the unit   Homicidal Thoughts:  No  Memory:  Immediate;   Fair Recent;   Fair Remote;   Fair  Judgement:  Fair  Insight:  Shallow  Psychomotor Activity:  Restlessness  Concentration:  Concentration: Poor and Attention Span: Fair  Recall:  [employer] of Knowledge:  Fair  Language:  Fair  Akathisia:  No  Handed:  Right  AIMS (if indicated):     Assets:  Desire for Improvement  ADL's:  Intact  Cognition:  WNL  Sleep:  Number of Hours: 3   Treatment Plan Summary: Malene is a  60 year old AA female who was admitted to [facility] from the [facility] with complaints of worsening symptoms of depression & SI. Patient today continues to be depressed and has sleep issues, continues to be suicidal, will continue treatment.  Daily contact with patient to assess and evaluate symptoms and progress in treatment and Medication management  Will continue Paxil 20 mg po daily for affective sx. Will continue Buspar 10 mg po tid for anxiety sx. Will continue Gabapentin 200 mg po bid for mood sx. Will continue Lamictal 25 mg po daily for bipolar do sx. Will continue Seroquel 400 mg po qhs for psychosis, mood, sleep.  Will increase vital signs if patient BP remains hypotensive. SHe is asymptomatic at this time.  Will continue symptomatic treatment for cough.   Reviewed past medical records,treatment plan.  Will continue to monitor vitals ,medication compliance and treatment side effects while patient is here.  Will monitor for medical issues as well as call consult as needed.  Reviewed labs ,will order tsh, lipid panel, hba1c. CSW will continue working on disposition.  Patient to participate in therapeutic milieu .    Truman Haywardakia S Starkes, FNP,  06/24/2016, 2:10 PM   Reviewed the information documented and agree with the treatment plan.  Marissah Vandemark 06/24/2016 4:02 PM

## 2016-06-24 NOTE — Plan of Care (Signed)
Problem: Activity: Goal: Interest or engagement in activities will improve Outcome: Progressing Pt has attended groups this weekend, and was appropriately engaged in group

## 2016-06-24 NOTE — [facility] Group Notes (Signed)
[facility] Group Notes:  (Clinical Social Work)  06/24/2016  10:00-11:00AM  Summary of Progress/Problems:   The main focus of today's process group was to   1)  discuss the importance of adding supports through the use of a role play/activity illustrating the front of "Healthy Supports" workbook given out today  2)  define health supports versus unhealthy supports  3)  identify the patient's current healthy supports   4)  plan healthy supports to add  An emphasis was placed on using counselor, doctor, therapy groups, 12-step groups, and problem-specific support groups to expand supports.    The patient expressed full comprehension of the concepts presented, and agreed that there is a need to add more supports.  The patient stated that her doctors and peers and [facility] are healthy supports, as are her mother, sister and children.  She does not know what healthy supports she could add, and stated she would listen to ideas from other group members.  She left group prior to the end, however.    Type of Therapy:  Process Group with Motivational Interviewing  Participation Level:  Active  Participation Quality:  Attentive  Affect:  Blunted  Cognitive:  Appropriate  Insight:  Improving  Engagement in Therapy:  Improving  Modes of Intervention:   Education, Support and Processing, Activity  Ambrose MantleMareida Grossman-Orr, LCSW 06/24/2016 11:05 AM

## 2016-06-24 NOTE — Progress Notes (Signed)
D.  Pt pleasant on approach, complaint of continued persistent cough.  Pt was positive for evening AA group, observed interacting appropriately with peers on the unit.  Pt denies SI/HI/hallucinatons at this time.  A.  Support and encouragement offered, medication given as ordered for cough/congestion.  R.  Pt remains safe on unit, will continue to monitor.

## 2016-06-25 MED ORDER — NICOTINE POLACRILEX 2 MG MT GUM
2.0000 mg | CHEWING_GUM | OROMUCOSAL | Status: DC | PRN
Start: 2016-06-25 — End: 2016-06-26
  Administered 2016-06-25: 2 mg via ORAL
  Filled 2016-06-25: qty 1

## 2016-06-25 NOTE — [facility] Group Notes (Signed)
[facility]   06/25/2016 11:12 AM  Participation Quality:  Left shortly after group started  Mood/Affect:  Depressed  Depression Rating:  UTA  Anxiety Rating:  UTA  Thoughts of Suicide:  UTA Will you contract for safety?   see above  Current AVH:  UTA  Plan for Discharge/Comments:  [place].  Unsure whether family will pay her fees at new clinic  [employer] Means: TBD based on discharge destination  Supports: family  Sallee Langenne C Cunningham

## 2016-06-25 NOTE — [facility] Group Notes (Signed)
[facility] LCSW Group Therapy  06/25/2016 1:15 PM  Type of Therapy:  Group Therapy  Participation Level:  Minimal  Participation Quality:  Inattentive  Affect:  Lethargic  Cognitive:  Lacking  Insight:  Limited  Engagement in Therapy:  Limited  Modes of Intervention:  Discussion and Exploration  Summary of Progress/Problems:  Today's Topic: Overcoming Obstacles. Patients identified one short term goal and potential obstacles in reaching this goal. Patients processed barriers involved in overcoming these obstacles. Patients identified steps necessary for overcoming these obstacles and explored motivation (internal and external) for facing these difficulties head on. Patient alternated between falling asleep and participating.  Spilled drink on herself and left group to get changed.  States her "procrastination" is her barrier to change.   Santa GeneraAnne Cunningham, LCSW Lead Clinical Social Worker Phone:  [phone] 06/25/2016 3:52 PM

## 2016-06-25 NOTE — Progress Notes (Signed)
Patient ID: Sydney Gonzales, female   DOB: [birth year], 60 y.o.   MRN: [id number] Patient ID: Sydney Gonzales, female   DOB: 06-Dec-1955, 60 y.o.   MRN: [id number] [facility] MD Progress Note  06/25/2016 4:49 PM Sydney Gonzales  MRN:  [id number]  Subjective: Sydney Gonzales states "I'm frustrated. I'm not sleeping well at night".  Objective: Sydney Gonzales is a  60 year old AA female who was admitted to [facility] from the [facility] with complaints of worsening symptoms of depression & SI.  Patient seen and chart reviewed. Discussed patient with treatment team. Pt reports that she is depressed & does not want be here any more (meaning that she is feeling suicidal). She continues to present flat affect during this follow-up assessment as she states that she does not want to be discharged as she has no place to go or live after discharge. Pt continues to ruminate about her several stressors - relationship issues with daughter and lack of social support. She continues on the mood stabilizer Lamictal & other medication regimen without any adverse effects reported. Sydney Gonzales is noted to be participating in group sessions. She presents as cheerful & with good affect when interacting with the other patients. She appears to be in no apparent distress.   Principal Problem: Bipolar disorder, curr episode depressed, severe, w/psychotic features (HCC) Diagnosis:   Patient Active Problem List   Diagnosis Date Noted  . Bipolar disorder, curr episode depressed, severe, w/psychotic features (HCC) [F31.5] 06/18/2016    Priority: High  . Cocaine use disorder, mild, abuse [F14.10] 06/18/2016    Priority: Medium  . PTSD (post-traumatic stress disorder) [F43.10] 06/18/2016  . Opioid use disorder, mild, on maintenance therapy [F11.90] 06/18/2016   Total Time spent with patient: 15 minutes  Past Psychiatric History: Hx of bipolar disorder - pls see H&p.  Past Medical History:  Past Medical History:  Diagnosis Date  . Abdominal adhesions   . Anxiety   .  Chronic abdominal pain   . Chronic back pain   . Hepatitis C   . Migraine     Past Surgical History:  Procedure Laterality Date  . ABDOMINAL HYSTERECTOMY    . APPENDECTOMY    . CHOLECYSTECTOMY    . GALLBLADDER SURGERY     Family History: Please see H&P.  Family Psychiatric  History: Please see H&P.  Social History: Please see H&P.  History  Alcohol Use No     History  Drug Use No    Social History   Social History  . Marital status: Divorced    Spouse name: N/A  . Number of children: N/A  . Years of education: N/A   Social History Main Topics  . Smoking status: Current Every Day Smoker    Packs/day: 0.50  . Smokeless tobacco: Never Used  . Alcohol use No  . Drug use: No  . Sexual activity: Not Asked   Other Topics Concern  . None   Social History Narrative   ** Merged History Encounter **       Additional Social History:   Sleep: Poor  Appetite:  Fair  Current Medications: Current Facility-Administered Medications  Medication Dose Route Frequency Provider Last Rate Last Dose  . acetaminophen (TYLENOL) tablet 650 mg  650 mg Oral Q6H PRN Kerry Hough, PA-C   650 mg at 06/25/16 1008  . alum & mag hydroxide-simeth (MAALOX/MYLANTA) 200-200-20 MG/5ML suspension 30 mL  30 mL Oral Q4H PRN Shuvon B Rankin, NP   30 mL at 06/22/16 2021  .  benzonatate (TESSALON) capsule 200 mg  200 mg Oral TID PRN Kerry Hough, PA-C   200 mg at 06/24/16 2200  . busPIRone (BUSPAR) tablet 10 mg  10 mg Oral TID Shuvon B Rankin, NP   10 mg at 06/25/16 1150  . cholecalciferol (VITAMIN D) tablet 2,000 Units  2,000 Units Oral Daily Craige Cotta, MD   2,000 Units at 06/25/16 0819  . feeding supplement (ENSURE ENLIVE) (ENSURE ENLIVE) liquid 237 mL  237 mL Oral BID BM Shuvon B Rankin, NP   237 mL at 06/25/16 1519  . gabapentin (NEURONTIN) capsule 300 mg  300 mg Oral [id number] Sanjuana Kava, NP   300 mg at 06/25/16 1519  . guaiFENesin (MUCINEX) 12 hr tablet 1,200 mg  1,200 mg Oral  BID PRN Kerry Hough, PA-C   1,200 mg at 06/24/16 2200  . hydrOXYzine (ATARAX/VISTARIL) tablet 50 mg  50 mg Oral QHS,MR X 1 Kerry Hough, PA-C   50 mg at 06/24/16 2201  . ipratropium-albuterol (DUONEB) 0.5-2.5 (3) MG/3ML nebulizer solution 3 mL  3 mL Nebulization Q6H PRN Truman Hayward, FNP   3 mL at 06/25/16 0914  . lamoTRIgine (LAMICTAL) tablet 25 mg  25 mg Oral Daily Jomarie Longs, MD   25 mg at 06/25/16 0818  . liver oil-zinc oxide (DESITIN) 40 % ointment   Topical TID PRN Truman Hayward, FNP      . loperamide (IMODIUM) capsule 2 mg  2 mg Oral PRN Truman Hayward, FNP   2 mg at 06/24/16 0545  . magnesium hydroxide (MILK OF MAGNESIA) suspension 30 mL  30 mL Oral Daily PRN Shuvon B Rankin, NP      . methadone (DOLOPHINE) tablet 67.5 mg  67.5 mg Oral Daily Shuvon B Rankin, NP   67.5 mg at 06/25/16 0817  . nicotine polacrilex (NICORETTE) gum 2 mg  2 mg Oral PRN Sanjuana Kava, NP   2 mg at 06/25/16 1104  . PARoxetine (PAXIL) tablet 20 mg  20 mg Oral Daily Shuvon B Rankin, NP   20 mg at 06/25/16 0819  . potassium chloride SA (K-DUR,KLOR-CON) CR tablet 20 mEq  20 mEq Oral Daily Shuvon B Rankin, NP   20 mEq at 06/25/16 0819  . QUEtiapine (SEROQUEL) tablet 400 mg  400 mg Oral QHS Jomarie Longs, MD   400 mg at 06/24/16 2107    Lab Results:  No results found for this or any previous visit (from the past 48 hour(s)). Blood Alcohol level:  Lab Results  Component Value Date   ETH <5 06/15/2016   ETH <5 06/14/2016    Metabolic Disorder Labs: Lab Results  Component Value Date   HGBA1C 5.5 06/20/2016   MPG 111 06/20/2016   MPG 114 03/18/2016   Lab Results  Component Value Date   PROLACTIN 6.5 03/18/2016   Lab Results  Component Value Date   CHOL 177 06/20/2016   TRIG 124 06/20/2016   HDL 50 06/20/2016   CHOLHDL 3.5 06/20/2016   VLDL 25 06/20/2016   LDLCALC 102 (H) 06/20/2016   LDLCALC 99 03/18/2016   Physical Findings: AIMS: Facial and Oral Movements Muscles of Facial  Expression: None, normal Lips and Perioral Area: None, normal Jaw: None, normal Tongue: None, normal,Extremity Movements Upper (arms, wrists, hands, fingers): None, normal Lower (legs, knees, ankles, toes): None, normal, Trunk Movements Neck, shoulders, hips: None, normal, Overall Severity Severity of abnormal movements (highest score from questions above): None, normal Incapacitation due to abnormal  movements: None, normal Patient's awareness of abnormal movements (rate only patient's report): No Awareness, Dental Status Current problems with teeth and/or dentures?: Yes Does patient usually wear dentures?: Yes  CIWA:    COWS:     Musculoskeletal: Strength & Muscle Tone: within normal limits Gait & Station: normal Patient leans: N/A  Psychiatric Specialty Exam: Physical Exam  Nursing note and vitals reviewed. Constitutional: She appears well-developed.  HENT:  Head: Normocephalic.  Eyes: Pupils are equal, round, and reactive to light.  Neck: Normal range of motion.  Cardiovascular: Normal rate.   Respiratory: Effort normal.  GI: Soft.  Genitourinary:  Genitourinary Comments: Denies any issues  Musculoskeletal: Normal range of motion.  Neurological: She is alert.  Skin: Skin is warm.    Review of Systems  Constitutional: Negative.  Negative for chills and fever.  HENT: Negative for congestion and sore throat.   Respiratory: Positive for cough.   Gastrointestinal: Negative.   Genitourinary: Negative.   Musculoskeletal: Negative.   Skin: Negative.   Neurological: Positive for headaches.  Psychiatric/Behavioral: Positive for depression, substance abuse (Hx Cocaine abuse) and suicidal ideas. Negative for hallucinations and memory loss. The patient is nervous/anxious and has insomnia ("Improving").   All other systems reviewed and are negative.   Blood pressure 100/70, pulse 75, temperature 97.8 F (36.6 C), temperature source Oral, resp. rate 18, height 5\' 8"  (1.727 m),  weight 82.1 kg (181 lb), SpO2 99 %.Body mass index is 27.52 kg/m.  General Appearance: Disheveled  Eye Contact:  Fair  Speech:  Normal Rate  Volume:  Decreased  Mood:  Anxious, Depressed and Dysphoric  Affect:  Depressed  Thought Process:  Goal Directed and Descriptions of Associations: Circumstantial  Orientation:  Full (Time, Place, and Person)  Thought Content:  Paranoid Ideation and Rumination  Suicidal Thoughts:  Yes.  with intent/plan patient contracts for safety on the unit   Homicidal Thoughts:  No  Memory:  Immediate;   Fair Recent;   Fair Remote;   Fair  Judgement:  Fair  Insight:  Shallow  Psychomotor Activity:  Restlessness  Concentration:  Concentration: Poor and Attention Span: Fair  Recall:  [employer]Fair  Fund of Knowledge:  Fair  Language:  Fair  Akathisia:  No  Handed:  Right  AIMS (if indicated):     Assets:  Desire for Improvement  ADL's:  Intact  Cognition:  WNL  Sleep:  Number of Hours: 4.25   Treatment Plan Summary: Sydney Gonzales is a  60 year old AA female who [facility] with complaints of worsening symptoms of depression & SI. Patient today continues to be depressed and has sleep issues, continues to be suicidal, will continue treatment.  Daily contact with patient to assess and evaluate symptoms and progress in treatment and Medication management  Will continue Paxil 20 mg po daily for affective sx. Will continue Buspar 10 mg po tid for anxiety sx. Will continue Gabapentin 200 mg po bid for mood sx. Will continue Lamictal 25 mg po daily for bipolar do sx. Will continue Seroquel 400 mg po qhs for psychosis, mood, sleep.  Will increase vital signs if patient BP remains hypotensive. SHe is asymptomatic at this time.  Restarted home medications where indicated. Will continue symptomatic treatment for cough.  Reviewed past medical records,treatment plan.  Will continue to monitor vitals ,medication compliance and treatment side  effects while patient is here.  Will monitor for medical issues as well as call consult as needed.  Reviewed labs ,will order tsh, lipid panel, hba1c. CSW will continue working on disposition.  Patient to participate in therapeutic milieu .  No changes made the current treatment plan.  Sanjuana Kava, NP, PMHNP, FNP-BC 06/25/2016, 4:49 PM

## 2016-06-25 NOTE — Progress Notes (Signed)
Patient ID: Beryle LatheCarol Gonzales, female   DOB: [birth year], 60 y.o.   MRN: [id number]  DAR: Pt. Denies SI/HI and A/V Hallucinations. She reports sleep is fair, appetite is fair, energy level is low, and concentration is poor. She rates her depression and anxiety 9/10. She reports, "I don't feel ready to leave yet. I still feel depressed." Patient does not report pain and is receiving both PRN and scheduled medications for these complaints. Support and encouragement provided to the patient. Scheduled medications administered to patient per physician's orders. Patient is somatic and intrusive at times. She is cooperative with her medication regimen and states that she feels that her medications are starting to work. She is seen in the milieu interacting with peers. During group times she is mostly seen wandering the hall and asking for medications. Q15 minute checks are maintained for safety.

## 2016-06-25 NOTE — Clinical Social Work Note (Signed)
CSW spoke w Rogelia BogaMichael Pierce of [facility], [facility] not [facility] any more as she left for "more than three days" and bed was not held for her.  Pt has stated she wants to go w [place], CSW investigating possibility of transferred her methadone clinic services to [place].  Santa GeneraAnne Elysa Womac, LCSW Lead Clinical Social Worker Phone:  [phone]

## 2016-06-25 NOTE — Progress Notes (Signed)
Recreation Therapy Notes  Date: 06/25/16 Time: 0930 Location: 300 Hall Dayroom  Group Topic: Stress Management  Goal Area(s) Addresses:  Patient will verbalize importance of using healthy stress management.  Patient will identify positive emotions associated with healthy stress management.   Behavioral Response: Engaged  Intervention: Stress Management  Activity :  Progressive Muscle Relaxation.  LRT introduced the technique of progressive muscle relaxation to group.  LRT read script to allow patients to participate in activity.  Patients were to follow along as LRT read the script.  Education:  Stress Management, Discharge Planning.   Education Outcome: Acknowledges edcuation/In group clarification offered/Needs additional education  Clinical Observations/Feedback: Pt attended group.   Caroll RancherMarjette Prestin Munch, LRT/CTRS         Caroll RancherLindsay, Arienna Benegas A 06/25/2016 12:36 PM

## 2016-06-25 NOTE — Progress Notes (Addendum)
Patient stated that she had a crappy nght. Patient rated depression at 8/ anxiety at a 9/ hopelessness at a 9. Patient endorses passive SI, Hi, and AVH. Patient is tearful an depression and stated that she did not want to bother her daughter with her "mess."  Patient remains safe with q 15 min checks. Support, encouragement, and education provided to patient. Meds administered.   Patient is receptive and compliant, will continue to monitor.

## 2016-06-25 NOTE — Clinical Social Work Note (Signed)
CSW spoke w [facility] Alcario Drought([place]) to discuss patient request to transfer services to [place] area.  Patient will need to sign consent for current clinic to release records to next clinic.  Santa GeneraAnne Cunningham, LCSW Lead Clinical Social Worker Phone:  [phone]

## 2016-06-26 MED ORDER — QUETIAPINE FUMARATE 400 MG PO TABS: 400.0000 mg | ORAL_TABLET | Freq: Every day | ORAL | 0 refills | Status: AC

## 2016-06-26 MED ORDER — NICOTINE POLACRILEX 2 MG MT GUM: 2.0000 mg | CHEWING_GUM | OROMUCOSAL | 0 refills | Status: AC | PRN

## 2016-06-26 MED ORDER — PAROXETINE HCL 20 MG PO TABS: 20.0000 mg | ORAL_TABLET | Freq: Every day | ORAL | 0 refills | Status: AC

## 2016-06-26 MED ORDER — BUSPIRONE HCL 10 MG PO TABS: 10.0000 mg | ORAL_TABLET | Freq: Three times a day (TID) | ORAL | 0 refills | Status: AC

## 2016-06-26 MED ORDER — LAMOTRIGINE 25 MG PO TABS: 25.0000 mg | ORAL_TABLET | Freq: Every day | ORAL | 0 refills | Status: AC

## 2016-06-26 MED ORDER — GABAPENTIN 300 MG PO CAPS: 300.0000 mg | ORAL_CAPSULE | ORAL | 0 refills | Status: AC

## 2016-06-26 MED ORDER — HYDROXYZINE HCL 50 MG PO TABS: 50.0000 mg | ORAL_TABLET | Freq: Every evening | ORAL | 0 refills | Status: AC | PRN

## 2016-06-26 NOTE — Progress Notes (Signed)
D:  Patient awake and alert; she denies suicidal and homicidal ideation and AVH;  No self-injurious behaviors noted or reported.  She attended and participated in group. A:  Medications given as scheduled;  Emotional support provided; encouraged her to seek assistance with needs/concerns. R:  Safety maintained on unit.  Plan is to discharge patient today.

## 2016-06-26 NOTE — [facility] Suicide Risk Assessment (Signed)
[facility]   Principal Problem: Bipolar disorder, curr episode depressed, severe, w/psychotic features [facility](HCC) Discharge Diagnoses:  Patient Active Problem List   Diagnosis Date Noted  . Bipolar disorder, curr episode depressed, severe, w/psychotic features (HCC) [F31.5] 06/18/2016  . PTSD (post-traumatic stress disorder) [F43.10] 06/18/2016  . Opioid use disorder, mild, on maintenance therapy [F11.90] 06/18/2016  . Cocaine use disorder, mild, abuse [F14.10] 06/18/2016    Total Time spent with patient: 30 minutes  Musculoskeletal: Strength & Muscle Tone: within normal limits Gait & Station: normal Patient leans: N/A  Psychiatric Specialty Exam: Review of Systems  Psychiatric/Behavioral: Positive for substance abuse. Negative for depression and hallucinations.  All other systems reviewed and are negative.   Blood pressure (!) 85/61, pulse 97, temperature 98.7 F (37.1 C), temperature source Oral, resp. rate 16, height 5\' 8"  (1.727 m), weight 82.1 kg (181 lb), SpO2 99 %.Body mass index is 27.52 kg/m.  General Appearance: Casual  Eye Contact::  Fair  Speech:  Normal Rate409  Volume:  Normal  Mood:  Euthymic  Affect:  Appropriate  Thought Process:  Goal Directed and Descriptions of Associations: Intact  Orientation:  Full (Time, Place, and Person)  Thought Content:  Logical  Suicidal Thoughts:  No  Homicidal Thoughts:  No  Memory:  Immediate;   Fair Recent;   Fair Remote;   Fair  Judgement:  Fair  Insight:  Fair  Psychomotor Activity:  Normal  Concentration:  Fair  Recall:  [employer]Fair  Fund of Knowledge:Fair  Language: Fair  Akathisia:  No  Handed:  Right  AIMS (if indicated):     Assets:  Desire for Improvement  Sleep:  Number of Hours: 5  Cognition: WNL  ADL's:  Intact   Mental Status Per Nursing Assessment::   On Admission:  Suicidal ideation indicated by patient, Self-harm thoughts, Intention to act on suicide plan  Demographic Factors:   Unemployed  Loss Factors: Financial problems/change in socioeconomic status  Historical Factors: Impulsivity  Risk Reduction Factors:   Positive social support  Continued Clinical Symptoms:  Alcohol/Substance Abuse/Dependencies Previous Psychiatric Diagnoses and Treatments  Cognitive Features That Contribute To Risk:  None    Suicide Risk:  Minimal: No identifiable suicidal ideation.  Patients presenting with no risk factors but with morbid ruminations; may be classified as minimal risk based on the severity of the depressive symptoms  Follow-up Information    [facility].   Specialty:  Emergency Medicine Why:  Return to ER for any new or worsening symptoms Contact information: [address] [id number] [place] [id number] [phone]       [facility] .   Specialty:  Behavioral Health Why:  Walk in hours are Monday, Wednesday and Friday between 12:30pm and 3:00pm.  Contact information: [address] [place] [address] [phone]        [facility] [place] .   Contact information: [address] [place]  [address] Phone:  [phone] Fax:  [phone]          Plan Of Care/Follow-up recommendations:  Activity:  no restrictions Diet:  regular  Adelisa Satterwhite, MD 06/26/2016, 10:02 AM

## 2016-06-26 NOTE — Tx Team (Signed)
Interdisciplinary Treatment and Diagnostic Plan Update  06/26/2016  Time of Session: 9:30am Cariana Karge MRN: [id number]  Principal Diagnosis: Bipolar disorder, curr episode depressed, severe, w/psychotic features (HCC)  Secondary Diagnoses: Principal Problem:   Bipolar disorder, curr episode depressed, severe, w/psychotic features (HCC) Active Problems:   PTSD (post-traumatic stress disorder)   Opioid use disorder, mild, on maintenance therapy   Cocaine use disorder, mild, abuse   Current Medications:           Current Facility-Administered Medications  Medication Dose Route Frequency Provider Last Rate Last Dose  . alum & mag hydroxide-simeth (MAALOX/MYLANTA) 200-200-20 MG/5ML suspension 30 mL  30 mL Oral Q4H PRN Shuvon B Rankin, NP      . benzonatate (TESSALON) capsule 200 mg  200 mg Oral TID PRN Kerry Hough, PA-C   200 mg at 06/19/16 0314  . busPIRone (BUSPAR) tablet 10 mg  10 mg Oral TID Shuvon B Rankin, NP   10 mg at 06/19/16 0752  . cholecalciferol (VITAMIN D) tablet 2,000 Units  2,000 Units Oral Daily Craige Cotta, MD   2,000 Units at 06/19/16 0747  . feeding supplement (ENSURE ENLIVE) (ENSURE ENLIVE) liquid 237 mL  237 mL Oral BID BM Shuvon B Rankin, NP   237 mL at 06/18/16 0821  . gabapentin (NEURONTIN) capsule 100 mg  100 mg Oral TID Jomarie Longs, MD   100 mg at 06/19/16 0748  . guaiFENesin (MUCINEX) 12 hr tablet 1,200 mg  1,200 mg Oral BID PRN Kerry Hough, PA-C   1,200 mg at 06/18/16 2146  . hydrOXYzine (ATARAX/VISTARIL) tablet 50 mg  50 mg Oral QHS,MR X 1 Kerry Hough, PA-C   50 mg at 06/18/16 2146  . magnesium hydroxide (MILK OF MAGNESIA) suspension 30 mL  30 mL Oral Daily PRN Shuvon B Rankin, NP      . methadone (DOLOPHINE) tablet 67.5 mg  67.5 mg Oral Daily Shuvon B Rankin, NP   67.5 mg at 06/19/16 0756  . nicotine (NICODERM CQ - dosed in mg/24 hours) patch 21 mg  21 mg Transdermal Daily Craige Cotta, MD   21 mg at 06/19/16 0748  . PARoxetine  (PAXIL) tablet 20 mg  20 mg Oral Daily Shuvon B Rankin, NP   20 mg at 06/19/16 0748  . potassium chloride SA (K-DUR,KLOR-CON) CR tablet 20 mEq  20 mEq Oral Daily Shuvon B Rankin, NP   20 mEq at 06/19/16 0747  . QUEtiapine (SEROQUEL) tablet 200 mg  200 mg Oral QHS Shuvon B Rankin, NP   200 mg at 06/18/16 2124   PTA Medications:        Prescriptions Prior to Admission  Medication Sig Dispense Refill Last Dose  . busPIRone (BUSPAR) 10 MG tablet Take 1 tablet (10 mg total) by mouth 3 (three) times daily. 90 tablet 0 a week  . Cholecalciferol (VITAMIN D3) 2000 units capsule Take 2,000 Units by mouth daily.   06/14/2016 at Unknown time  . dicyclomine (BENTYL) 20 MG tablet Take 1 tablet (20 mg total) by mouth 2 (two) times daily as needed for spasms. (Patient not taking: Reported on 06/15/2016) 10 tablet 0 Not Taking at Unknown time  . methadone (DOLOPHINE) 10 MG/ML solution Take 67.5 mg by mouth daily.    06/15/2016 at Unknown time  . PARoxetine (PAXIL) 20 MG tablet Take 1 tablet (20 mg total) by mouth daily. 30 tablet 0 a week  . potassium chloride SA (K-DUR,KLOR-CON) 20 MEQ tablet Take 1 tablet (20  mEq total) by mouth daily. 3 tablet 0 a week  . QUEtiapine (SEROQUEL) 200 MG tablet Take 200 mg by mouth at bedtime.   06/14/2016 at Unknown time  . vitamin B-12 (CYANOCOBALAMIN) 100 MCG tablet Take 100 mcg by mouth daily.   06/14/2016 at Unknown time    Treatment Modalities: Medication Management, Group therapy, Case management,  1 to 1 session with clinician, Psychoeducation, Recreational therapy.   Physician Treatment Plan for Primary Diagnosis: Bipolar disorder, curr episode depressed, severe, w/psychotic features (HCC) Long Term Goal(s): Improvement in symptoms so as ready for discharge   Short Term Goals: Ability to identify changes in lifestyle to reduce recurrence of condition will improve, Ability to identify and develop effective coping behaviors will improve and Compliance with  prescribed medications will improve  Medication Management: Evaluate patient's response, side effects, and tolerance of medication regimen.  Therapeutic Interventions: 1 to 1 sessions, Unit Group sessions and Medication administration.  Evaluation of Outcomes: Adequate for discharge  Physician Treatment Plan for Secondary Diagnosis: Principal Problem:   Bipolar disorder, curr episode depressed, severe, w/psychotic features (HCC) Active Problems:   PTSD (post-traumatic stress disorder)   Opioid use disorder, mild, on maintenance therapy   Cocaine use disorder, mild, abuse  Long Term Goal(s): Improvement in symptoms so as ready for discharge  Short Term Goals: Ability to identify changes in lifestyle to reduce recurrence of condition will improve, Ability to identify and develop effective coping behaviors will improve and Compliance with prescribed medications will improve  Medication Management: Evaluate patient's response, side effects, and tolerance of medication regimen.  Therapeutic Interventions: 1 to 1 sessions, Unit Group sessions and Medication administration.  Evaluation of Outcomes: Adequate for discharge   RN Treatment Plan for Primary Diagnosis: Bipolar disorder, curr episode depressed, severe, w/psychotic features (HCC) Long Term Goal(s): Knowledge of disease and therapeutic regimen to maintain health will improve  Short Term Goals: Ability to participate in decision making will improve, Ability to verbalize feelings will improve and Ability to identify and develop effective coping behaviors will improve  Medication Management: RN will administer medications as ordered by provider, will assess and evaluate patient's response and provide education to patient for prescribed medication. RN will report any adverse and/or side effects to prescribing provider.  Therapeutic Interventions: 1 on 1 counseling sessions, Psychoeducation, Medication administration,  Evaluate responses to treatment, Monitor vital signs and CBGs as ordered, Perform/monitor CIWA, COWS, AIMS and Fall Risk screenings as ordered, Perform wound care treatments as ordered.  Evaluation of Outcomes: Adequate for discharge   LCSW Treatment Plan for Primary Diagnosis: Bipolar disorder, curr episode depressed, severe, w/psychotic features (HCC) Long Term Goal(s): Safe transition to appropriate next level of care at discharge, Engage patient in therapeutic group addressing interpersonal concerns.  Short Term Goals: Engage patient in aftercare planning with referrals and resources, Increase social support and Increase skills for wellness and recovery  Therapeutic Interventions: Assess for all discharge needs, 1 to 1 time with Social worker, Explore available resources and support systems, Assess for adequacy in community support network, Educate family and significant other(s) on suicide prevention, Complete Psychosocial Assessment, Interpersonal group therapy.  Evaluation of Outcomes: Adequate for discharge   Progress in Treatment :  Attending groups: Yes  Participating in groups: Minimally  Taking medication as prescribed: Yes, MD continuing to assess for appropriate medication regimen  Toleration medication: Yes  Family/Significant other contact made: Pt refused family contact   Patient understands diagnosis: Yes  Discussing patient identified problems/goals with staff: Yes  Medical problems stabilized or resolved: Yes  Denies suicidal/homicidal ideation: Yes, denies  Issues/concerns per patient self-inventory: None reported  Other: N/A  New problem(s) identified: None reported at this time    New Short Term/Long Term Goal(s): None at this time    Discharge Plan or Barriers: Patient plans to stay with family in [place] to follow up with outpatient providers.    Reason for Continuation of  Hospitalization: Anxiety Depression Medication stabilization Suicidal Ideations Withdrawal symptoms  Estimated Length of Stay: Discharge anticipated for today 06/26/16    Attendees:  Patient:  Physician: Dr. Seth BakeLekwauwa, MD   Nursing: Joslyn Devonaroline Beaudry, Ida RogueKaren Castro, Leighton ParodyBritney Tyson, Elby Beckoni Waller, RN  RN Care Manager: Onnie BoerJennifer Clark, CM  Social Workers: West CarboKristin L Resean Brander, LCSWA, Chad CordialLauren Carter, LCSW  Nurse Pratictioners: Karolee StampsMay Augustin, Aggie Nwoko, NP   Scribe for Treatment Team: West CarboKristin L Kately Graffam MSW, [facility]CSWA  06/26/2016

## 2016-06-26 NOTE — Discharge Summary (Signed)
Physician Discharge Summary Note  Patient:  Sydney Gonzales is an 60 y.o., female MRN:  [id number] DOB:  1956/10/03 Patient phone:  [phone] (home)  Patient address:   [place] [address],  Total Time spent with patient: 30 minutes  Date of Admission:  06/17/2016 Date of Discharge: 06/26/2016  Reason for Admission:  worsening depression, cocaine abuse  Principal Problem: Bipolar disorder, curr episode depressed, severe, w/psychotic features [facility]) Discharge Diagnoses: Patient Active Problem List   Diagnosis Date Noted  . Bipolar disorder, curr episode depressed, severe, w/psychotic features (HCC) [F31.5] 06/18/2016  . PTSD (post-traumatic stress disorder) [F43.10] 06/18/2016  . Opioid use disorder, mild, on maintenance therapy [F11.90] 06/18/2016  . Cocaine use disorder, mild, abuse [F14.10] 06/18/2016    Past Psychiatric History: see HPI  Past Medical History:  Past Medical History:  Diagnosis Date  . Abdominal adhesions   . Anxiety   . Chronic abdominal pain   . Chronic back pain   . Hepatitis C   . Migraine     Past Surgical History:  Procedure Laterality Date  . ABDOMINAL HYSTERECTOMY    . APPENDECTOMY    . CHOLECYSTECTOMY    . GALLBLADDER SURGERY     Family History: History reviewed. No pertinent family history. Family Psychiatric  History:  See HPI Social History:  History  Alcohol Use No     History  Drug Use No    Social History   Social History  . Marital status: Divorced    Spouse name: N/A  . Number of children: N/A  . Years of education: N/A   Social History Main Topics  . Smoking status: Current Every Day Smoker    Packs/day: 0.50  . Smokeless tobacco: Never Used  . Alcohol use No  . Drug use: No  . Sexual activity: Not Asked   Other Topics Concern  . None   Social History Narrative   ** Merged History Encounter **        Hospital Course:  Sydney Gonzales, 60 yo, admitted with worsening depression due to recent death of a  cousin.  She also abused cocaine.    Sydney Gonzales was admitted for Bipolar disorder, curr episode depressed, severe, w/psychotic features (HCC) and crisis management.  She was treated with medication with their indications listed below in detail under Medication List.  Medical problems were identified and treated as needed.  Home medications were restarted as appropriate.  Improvement was monitored by observation and Sydney Gonzales daily report of symptom reduction.  Emotional and mental status was monitored by daily self inventory reports completed by Sydney Gonzales and clinical staff.  Patient reported continued improvement, denied any new concerns.  Patient had been compliant on medications and denied side effects.  Support and encouragement was provided.    At time of discharge, patient rated both depression and anxiety levels to be manageable and minimal.  Patient encouraged to attend groups to help with recognizing triggers of emotional crises and de-stabilizations.  Patient encouraged to attend group to help identify the positive things in life that would help in dealing with feelings of loss, depression and unhealthy or abusive tendencies.         Sydney Gonzales was evaluated by the treatment team for stability and plans for continued recovery upon discharge.  She was offered further treatment options upon discharge including Residential, Intensive Outpatient and Outpatient treatment.  She will follow up with agencies listed below for medication management and counseling.  Encouraged patient to maintain satisfactory  support network and home environment.  Advised to adhere to medication compliance and outpatient treatment follow up.  Prescriptions provided.       Sydney Gonzales motivation was an integral factor for scheduling further treatment.  Employment, transportation, bed availability, health status, family support, and any pending legal issues were also considered during her hospital stay.  Upon completion of  this admission the patient was both mentally and medically stable for discharge denying suicidal/homicidal ideation, auditory/visual/tactile hallucinations, delusional thoughts and paranoia.      Physical Findings: AIMS: Facial and Oral Movements Muscles of Facial Expression: None, normal Lips and Perioral Area: None, normal Jaw: None, normal Tongue: None, normal,Extremity Movements Upper (arms, wrists, hands, fingers): None, normal Lower (legs, knees, ankles, toes): None, normal, Trunk Movements Neck, shoulders, hips: None, normal, Overall Severity Severity of abnormal movements (highest score from questions above): None, normal Incapacitation due to abnormal movements: None, normal Patient's awareness of abnormal movements (rate only patient's report): No Awareness, Dental Status Current problems with teeth and/or dentures?: Yes Does patient usually wear dentures?: Yes  CIWA:    COWS:     Musculoskeletal: Strength & Muscle Tone: within normal limits Gait & Station: normal Patient leans: N/A  Psychiatric Specialty Exam:  SEE MD SRA Physical Exam  ROS  Blood pressure (!) 85/61, pulse 97, temperature 98.7 F (37.1 C), temperature source Oral, resp. rate 16, height 5\' 8"  (1.727 m), weight 82.1 kg (181 lb), SpO2 99 %.Body mass index is 27.52 kg/m.   Have you used any form of tobacco in the last 30 days? (Cigarettes, Smokeless Tobacco, Cigars, and/or Pipes): Yes  Has this patient used any form of tobacco in the last 30 days? (Cigarettes, Smokeless Tobacco, Cigars, and/or Pipes) Yes, N/A  Blood Alcohol level:  Lab Results  Component Value Date   [facility] <5 06/15/2016   ETH <5 06/14/2016    Metabolic Disorder Labs:  Lab Results  Component Value Date   HGBA1C 5.5 06/20/2016   MPG 111 06/20/2016   MPG 114 03/18/2016   Lab Results  Component Value Date   PROLACTIN 6.5 03/18/2016   Lab Results  Component Value Date   CHOL 177 06/20/2016   TRIG 124 06/20/2016   HDL 50  06/20/2016   CHOLHDL 3.5 06/20/2016   VLDL 25 06/20/2016   LDLCALC 102 (H) 06/20/2016   LDLCALC 99 03/18/2016    See Psychiatric Specialty Exam and Suicide Risk Assessment completed by Attending Physician prior to discharge.  Discharge destination:  Home  Is patient on multiple antipsychotic therapies at discharge:  No   Has Patient had three or more failed trials of antipsychotic monotherapy by history:  No  Recommended Plan for Multiple Antipsychotic Therapies: NA     Medication List    ASK your doctor about these medications     Indication  busPIRone 10 MG tablet Commonly known as:  BUSPAR Take 1 tablet (10 mg total) by mouth 3 (three) times daily.  Indication:  Anxiety Disorder   dicyclomine 20 MG tablet Commonly known as:  BENTYL Take 1 tablet (20 mg total) by mouth 2 (two) times daily as needed for spasms.    methadone 10 MG/ML solution Commonly known as:  DOLOPHINE Take 67.5 mg by mouth daily.    PARoxetine 20 MG tablet Commonly known as:  PAXIL Take 1 tablet (20 mg total) by mouth daily.  Indication:  Major Depressive Disorder   potassium chloride SA 20 MEQ tablet Commonly known as:  K-DUR,KLOR-CON Take 1 tablet (20  mEq total) by mouth daily.    QUEtiapine 200 MG tablet Commonly known as:  SEROQUEL Take 200 mg by mouth at bedtime.    vitamin B-12 100 MCG tablet Commonly known as:  CYANOCOBALAMIN Take 100 mcg by mouth daily.    Vitamin D3 2000 units capsule Take 2,000 Units by mouth daily.       Follow-up Information    [facility].   Specialty:  Emergency Medicine Why:  Return to ER for any new or worsening symptoms Contact information: [address] [id number] [place] [id number] [phone]       [facility] .   Specialty:  Behavioral Health Why:  Walk in hours are Monday, Sydney and Friday between 12:30pm and 3:00pm.  Contact information: [address] [place] [address] [phone]        [facility] [place] Follow up on 06/27/2016.   Why:  Please arrive between 6am-8am on Sydney 9/13 to be assessed for methadone treatment services. Cost is $13 per day if uninsured.  Contact information: [address] [place]  [id number] Phone:  [phone] Fax:  [phone]          Follow-up recommendations:  Activity:  as tol Diet:  as tol  Comments:  1.  Take all your medications as prescribed.   2.  Report any adverse side effects to outpatient provider. 3.  Patient instructed to not use alcohol or illegal drugs while on prescription medicines. 4.  In the event of worsening symptoms, instructed patient to call 911, the crisis hotline or go to nearest emergency room for evaluation of symptoms.  Signed: Lindwood QuaSheila May Shaughnessy Gethers, NP [facility] 06/26/2016, 10:25 AM

## 2016-06-26 NOTE — Discharge Planning (Signed)
[facility] DISCHARGE INSTRUCTIONS   Follow-up recommendations:  Activity:  As tolerated  Comments:  Written/verbal discharge instructions, prescriptions, sample medications and follow-up appointments given to patient with verbalization of understanding;  Patient denies suicidal and homicidal ideation. Suicide Prevention information/materials given to patient  All patient belongings returned to patient at time of discharge. Discharged in stable condition, Patient given taxi voucher to take her to bus stations and bus ticket [place] where family to pick her up to take [place] to [place]  The patient received suicide prevention pamphlet:  Yes Belongings returned:  ALL Belongings returned to patient at time of discharge  Camelia EngKaren H Olney Monier 06/26/2016, 11:30 AM

## 2016-06-26 NOTE — [facility] Suicide Risk Assessment (Signed)
[facility] INPATIENT:  Family/Significant Other Suicide Prevention Education  Suicide Prevention Education:  Patient Refusal for Family/Significant Other Suicide Prevention Education: The patient Sydney LatheCarol Gonzales has refused to provide written consent for family/significant other to be provided Family/Significant Other Suicide Prevention Education during admission and/or prior to discharge.  Physician notified. SPE reviewed with patient and brochure provided. Patient encouraged to return to hospital if having suicidal thoughts, patient verbalized his/her understanding and has no further questions at this time.   Sydney Gonzales 06/26/2016, 10:01 AM

## 2016-06-26 NOTE — [facility] Group Notes (Signed)
[facility] Group Notes:  (Nursing/MHT/Case Management/Adjunct)  Date:  06/26/2016  Time:  2:48 AM  Type of Therapy:  Group Therapy  Participation Level:  Active  Participation Quality:  Appropriate  Affect:  Anxious  Cognitive:  Alert  Insight:  Improving  Engagement in Group:  Developing/Improving and Distracting  Modes of Intervention:  Discussion and Education  Summary of Progress/Problems: pt stated she had a " long Ass day, glad to be here"  Delos Haringhillips, Kemia Wendel A 06/26/2016, 2:48 AM

## 2016-06-26 NOTE — Progress Notes (Signed)
CSW received phone call from patient after she had discharged. She reports that the greyhound bus that had been arranged by CSW had been cancelled and that she had been able to reschedule it for tomorrow morning 9/13. She reports that she is unable to stay at the bus station overnight. CSW encouraged patient to contact or go to [facility] to see if she can stay in lobby overnight if no bed available. Patient agreeable.  Samuella BruinKristin Montrail Mehrer, LCSW Clinical Social Worker [facility] [phone]

## 2016-06-26 NOTE — Progress Notes (Signed)
  [facility] Discharge Plan :  Will you be returning to the same living situation after discharge:  No.  Patient plans to stay with family in [place] At discharge, do you have transportation home?: Yes,  taxi voucher and [employer] bus ticket provided Do you have the ability to pay for your medications: Yes,  patient will be provided with prescriptions at discharge  Release of information consent forms completed and in the chart;  Patient's signature needed at discharge.  Patient to Follow up at: Follow-up Information    [facility].   Specialty:  Emergency Medicine Why:  Return to ER for any new or worsening symptoms Contact information: [address] [id number] [place] [id number] [phone]       [facility] .   Specialty:  Behavioral Health Why:  Walk in hours are Monday, Wednesday and Friday between 12:30pm and 3:00pm.  Contact information: [address] [place] [address] [phone]        [facility] [place] Follow up on 06/27/2016.   Why:  Please arrive between 6am-8am on Wednesday 9/13 to be assessed for methadone treatment services. Cost is $13 per day if uninsured.  Contact information: [address] [place]  [id number] Phone:  [phone] Fax:  [phone]          Next level of care provider has access to [facility] Link:no  Safety Planning and Suicide Prevention discussed: Yes,  with patient  Have you used any form of tobacco in the last 30 days? (Cigarettes, Smokeless Tobacco, Cigars, and/or Pipes): Yes  Has patient been referred to the Quitline?: Patient refused referral  Patient has been referred for addiction treatment: Yes  Sydney Gonzales 06/26/2016, 10:07 AM

## 2017-05-01 IMAGING — CT CT ABD-PELV W/ CM
2 of 4 series · 16 of 46 positions shown, 18 images · IV contrast (ISOVUE)
Comparison: None.

CLINICAL DATA: Acute onset of right upper quadrant abdominal pain.
Nausea and vomiting. Initial encounter.

EXAM:
CT ABDOMEN AND PELVIS WITH CONTRAST
TECHNIQUE: Multidetector CT imaging of the abdomen and pelvis was performed
using the standard protocol following bolus administration of
intravenous contrast.
CONTRAST:  100mL [id number] IOPAMIDOL ([id number]) INJECTION 61%

[Series 2: abd/pel with · axial · 0.73mm/px · z∈[-501,-101]mm · 13 of 88 slices shown, 15 images]
[im 4/88  soft-tissue]
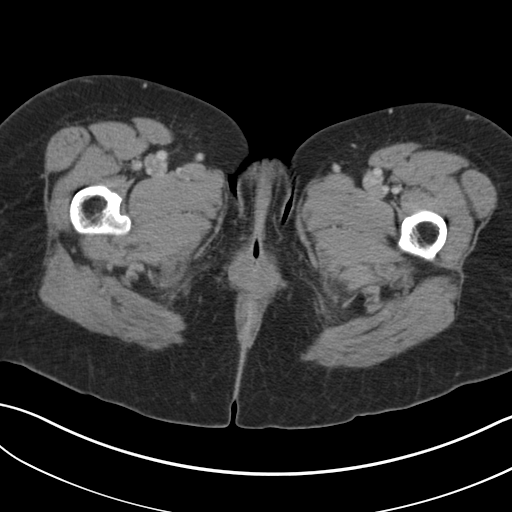
[im 4/88  bone]
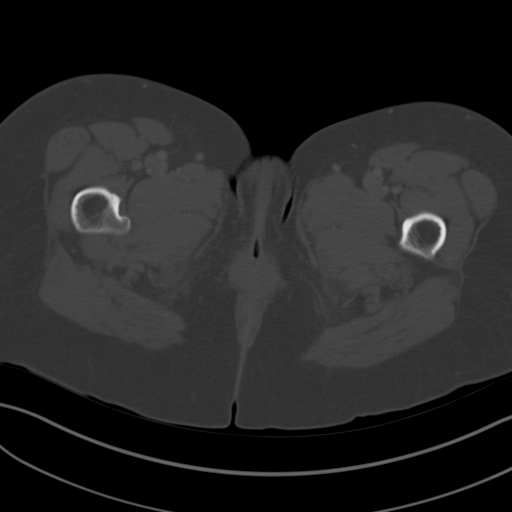
[im 12/88  soft-tissue]
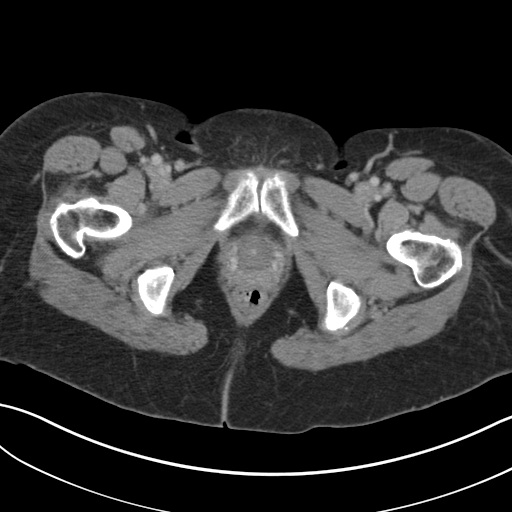
[im 20/88  soft-tissue]
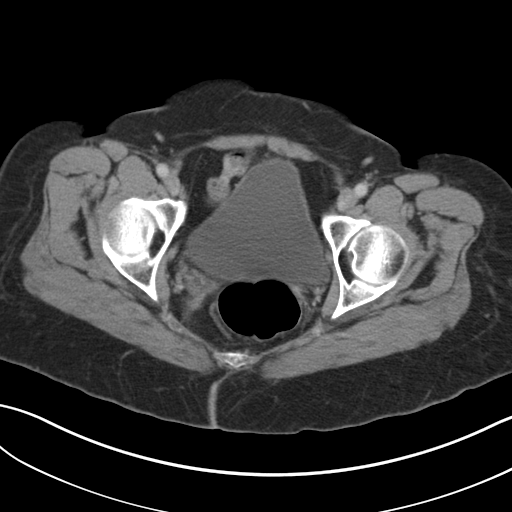
[im 24/88  soft-tissue]
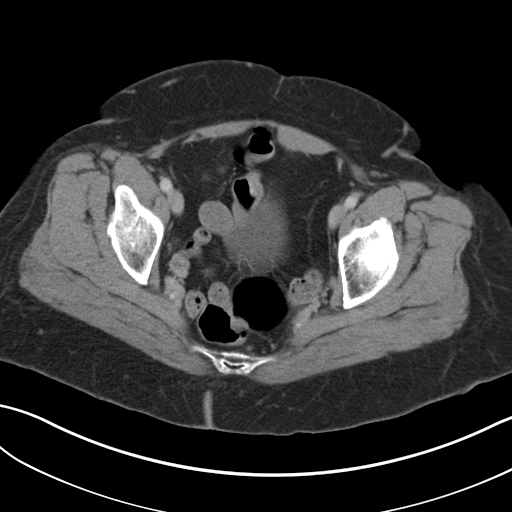
[im 32/88  soft-tissue]
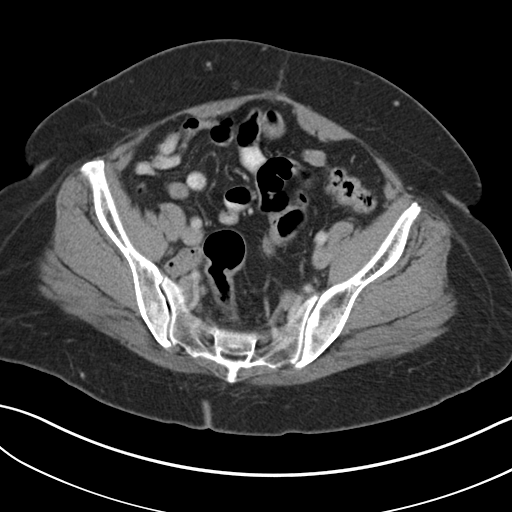
[im 36/88  soft-tissue]
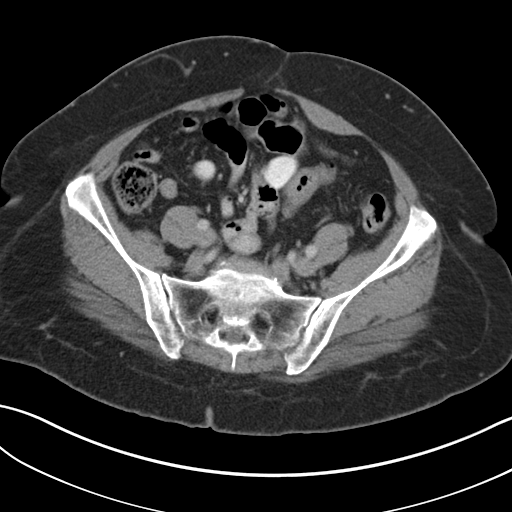
[im 44/88  soft-tissue]
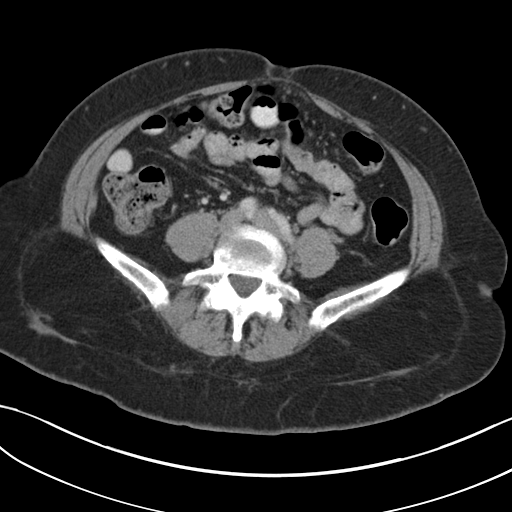
[im 52/88  soft-tissue]
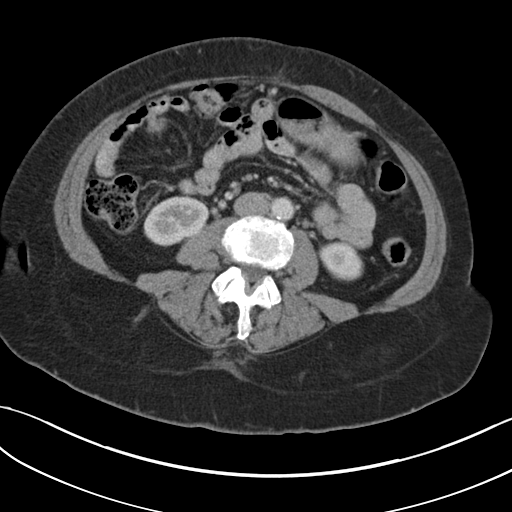
[im 56/88  soft-tissue]
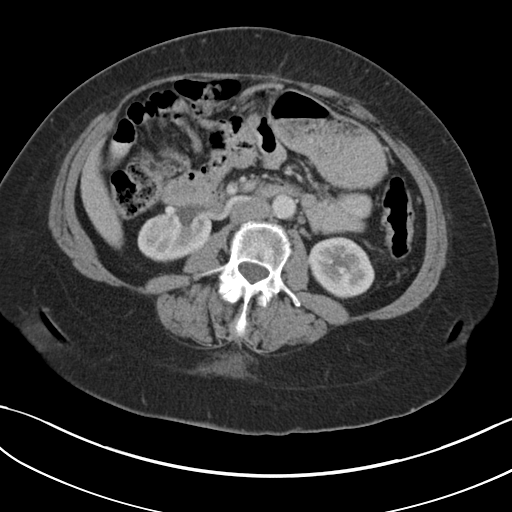
[im 56/88  bone]
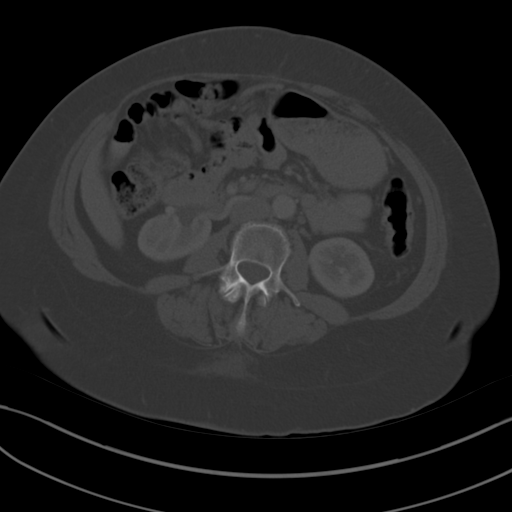
[im 64/88  soft-tissue]
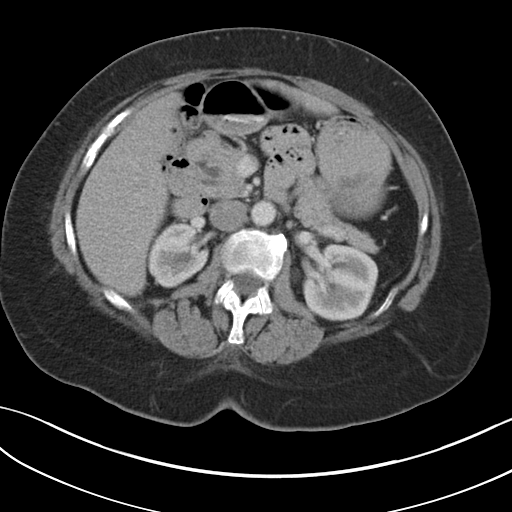
[im 68/88  soft-tissue]
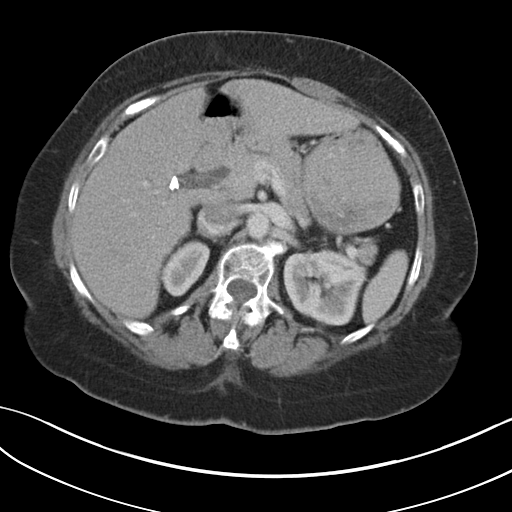
[im 76/88  soft-tissue]
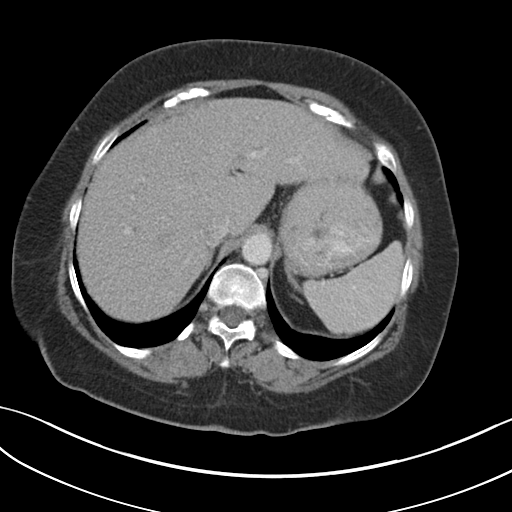
[im 84/88  soft-tissue]
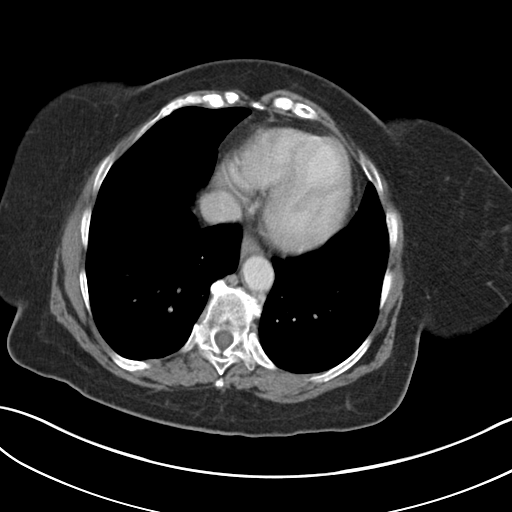

[Series 5: coronal a/|p · coronal · 0.72mm/px · 3 of 142 slices shown]
[im 48/142  soft-tissue]
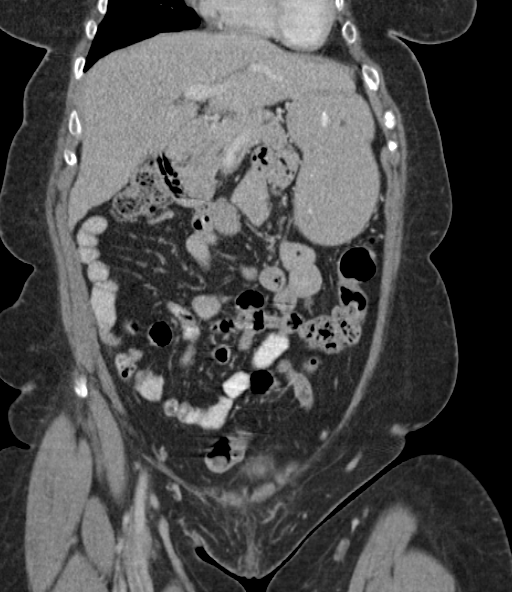
[im 63/142  soft-tissue]
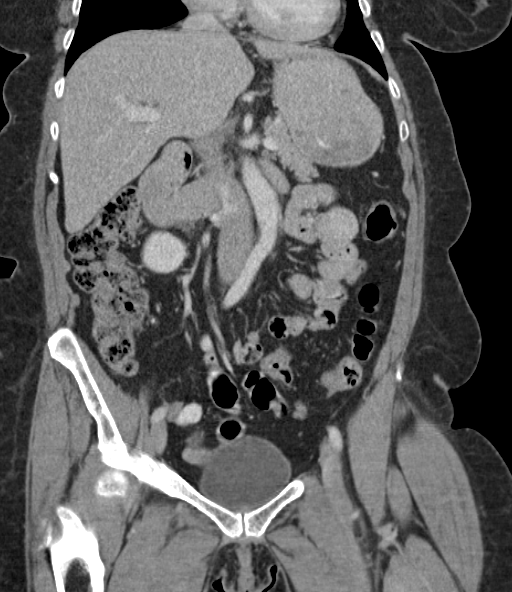
[im 79/142  soft-tissue]
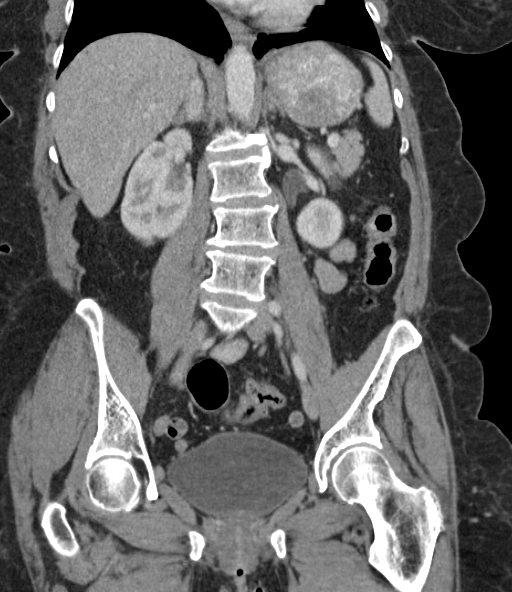

[16 of 46 positions shown; findings below may reference images not displayed]

FINDINGS: The visualized lung bases are clear.

The liver and spleen are unremarkable in appearance. The patient is
status post cholecystectomy, with clips noted at the gallbladder
fossa. The pancreas and adrenal glands are unremarkable.

A few small left renal cysts are seen. The kidneys are otherwise
unremarkable. There is no evidence of hydronephrosis. No renal or
ureteral stones are seen. No perinephric stranding is appreciated.

No free fluid is identified. The small bowel is unremarkable in
appearance. The stomach is within normal limits. No acute vascular
abnormalities are seen. Mild calcification is seen along the
abdominal aorta and its branches.

The patient is status post appendectomy. The colon is grossly
unremarkable in appearance. There is slight outpouching of a wall of
the transverse colon at the umbilicus, without significant
herniation.

The bladder is moderately distended and grossly unremarkable. The
patient is status post hysterectomy. No suspicious adnexal masses
are seen. No inguinal lymphadenopathy is seen.

No acute osseous abnormalities are identified.
IMPRESSION: 1. No acute abnormality seen within the abdomen or pelvis.
2. Few small left renal cysts seen.
3. Mild calcification along the abdominal aorta and its branches.

## 2017-07-30 IMAGING — DX DG CHEST 2V
2 series · 2 of 2 positions shown · non-contrast
Comparison: 04/22/2016

CLINICAL DATA: Mid chest pain radiating down the left arm today.
Smoker.

EXAM:
CHEST  2 VIEW

[chest pa]
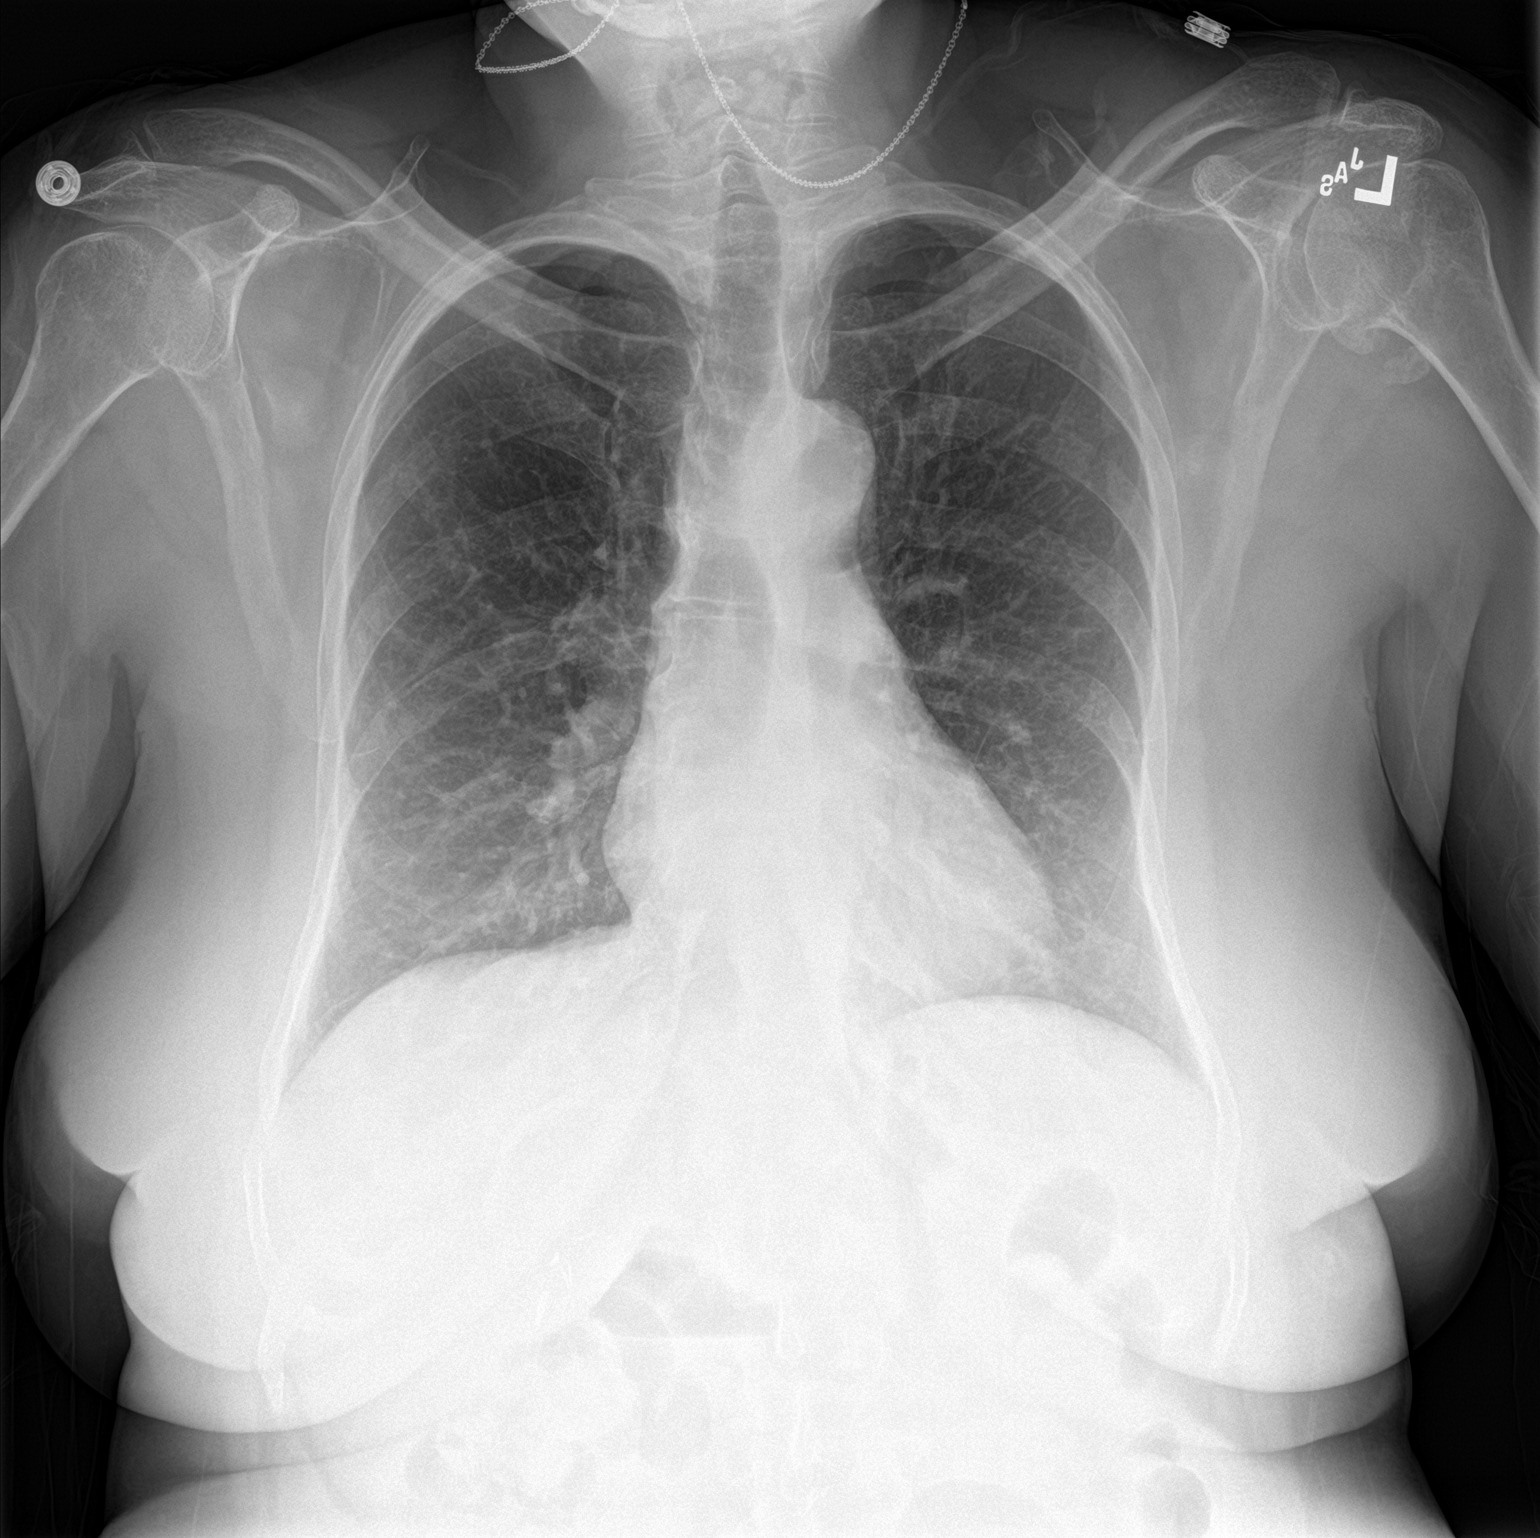

[chest lat]
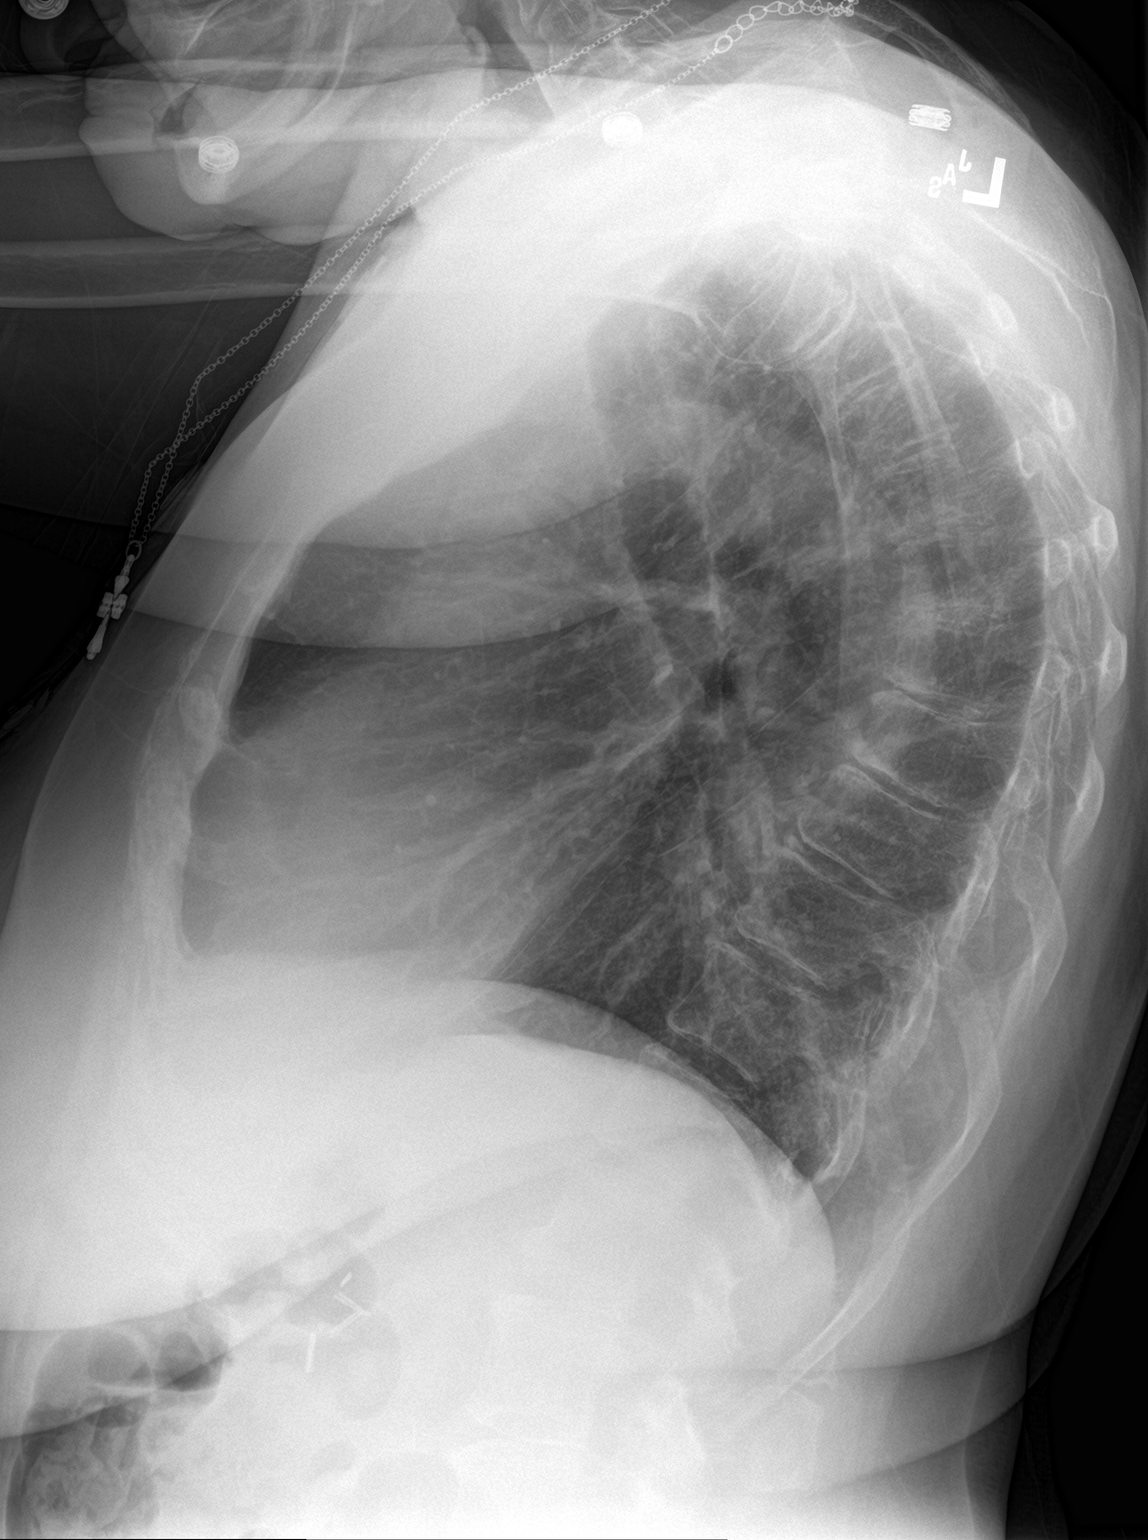

[2 of 2 positions shown; findings below may reference images not displayed]

FINDINGS: Normal heart size and pulmonary vascularity. No focal airspace
disease or consolidation in the lungs. No blunting of costophrenic
angles. No pneumothorax. Mediastinal contours appear intact.
Tortuous aorta. Degenerative changes in the spine and shoulders.
Surgical clips in the right upper quadrant.
IMPRESSION: No active cardiopulmonary disease.

## 2021-12-13 DEATH — deceased
